# Patient Record
Sex: Female | Born: 2000 | Race: Black or African American | Hispanic: No | Marital: Single | Smoking: Never smoker
Health system: Southern US, Community
[De-identification: ages and names within clinical notes are randomized; demographics above are authoritative.]

## PROBLEM LIST (undated history)

## (undated) ENCOUNTER — Inpatient Hospital Stay (HOSPITAL_COMMUNITY): Payer: Self-pay

## (undated) DIAGNOSIS — T7840XA Allergy, unspecified, initial encounter: Secondary | ICD-10-CM

## (undated) DIAGNOSIS — J45909 Unspecified asthma, uncomplicated: Secondary | ICD-10-CM

## (undated) DIAGNOSIS — N390 Urinary tract infection, site not specified: Secondary | ICD-10-CM

## (undated) HISTORY — DX: Unspecified asthma, uncomplicated: J45.909

## (undated) HISTORY — DX: Allergy, unspecified, initial encounter: T78.40XA

## (undated) HISTORY — PX: TOE SURGERY: SHX1073

---
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
## 2019-01-11 DIAGNOSIS — Z113 Encounter for screening for infections with a predominantly sexual mode of transmission: Secondary | ICD-10-CM | POA: Diagnosis not present

## 2019-02-01 ENCOUNTER — Encounter (INDEPENDENT_AMBULATORY_CARE_PROVIDER_SITE_OTHER): Payer: Self-pay

## 2019-02-01 ENCOUNTER — Encounter: Payer: Self-pay | Admitting: Physician Assistant

## 2019-02-01 ENCOUNTER — Telehealth: Payer: BLUE CROSS/BLUE SHIELD | Admitting: Physician Assistant

## 2019-02-01 DIAGNOSIS — Z20822 Contact with and (suspected) exposure to covid-19: Secondary | ICD-10-CM

## 2019-02-01 DIAGNOSIS — R05 Cough: Secondary | ICD-10-CM

## 2019-02-01 DIAGNOSIS — R6889 Other general symptoms and signs: Secondary | ICD-10-CM

## 2019-02-01 DIAGNOSIS — Z8709 Personal history of other diseases of the respiratory system: Secondary | ICD-10-CM | POA: Insufficient documentation

## 2019-02-01 DIAGNOSIS — J45901 Unspecified asthma with (acute) exacerbation: Secondary | ICD-10-CM

## 2019-02-01 DIAGNOSIS — R0602 Shortness of breath: Secondary | ICD-10-CM

## 2019-02-01 DIAGNOSIS — R059 Cough, unspecified: Secondary | ICD-10-CM

## 2019-02-01 MED ORDER — PREDNISONE 20 MG PO TABS: 40.0000 mg | ORAL_TABLET | Freq: Every day | ORAL | 0 refills | Status: DC

## 2019-02-01 MED ORDER — BENZONATATE 100 MG PO CAPS: 100.0000 mg | ORAL_CAPSULE | Freq: Two times a day (BID) | ORAL | 0 refills | Status: DC | PRN

## 2019-02-01 MED ORDER — DOXYCYCLINE HYCLATE 100 MG PO TABS: 100.0000 mg | ORAL_TABLET | Freq: Two times a day (BID) | ORAL | 0 refills | Status: DC

## 2019-02-01 MED ORDER — ALBUTEROL SULFATE HFA 108 (90 BASE) MCG/ACT IN AERS: 2.0000 | INHALATION_SPRAY | Freq: Four times a day (QID) | RESPIRATORY_TRACT | 0 refills | Status: DC | PRN

## 2019-02-01 NOTE — Progress Notes (Signed)
E-Visit for Corona Virus Screening   Your current symptoms could be consistent with the coronavirus.  Many health care providers can now test patients at their office but not all are.  [place] has multiple testing sites. For information on our COVID testing locations and hours go to [email or web address]  Please quarantine yourself while awaiting your test results.  We are enrolling you in our [place] for [place] . Daily you will receive a questionnaire within the [place] website. Our COVID 19 response team willl be monitoriing your responses daily.    COVID-19 is a respiratory illness with symptoms that are similar to the flu. Symptoms are typically mild to moderate, but there have been cases of severe illness and death due to the virus. The following symptoms may appear 2-14 days after exposure: . Fever . Cough . Shortness of breath or difficulty breathing . Chills . Repeated shaking with chills . Muscle pain . Headache . Sore throat . New loss of taste or smell . Fatigue . Congestion or runny nose . Nausea or vomiting . Diarrhea  It is vitally important that if you feel that you have an infection such as this virus or any other virus that you stay home and away from places where you may spread it to others.  You should self-quarantine for 14 days if you have symptoms that could potentially be coronavirus or have been in close contact a with a person diagnosed with COVID-19 within the last 2 weeks. You should avoid contact with people age 46 and older.   You should wear a mask or cloth face covering over your nose and mouth if you must be around other people or animals, including pets (even at home). Try to stay at least 6 feet away from other people. This will protect the people around you.  You can use medication such as A prescription cough medication called Tessalon Perles 100 mg. You may take 1-2 capsules every 8 hours as needed for cough    I have also provided treatment for asthma exacerbation. Attempted to get more information form you with no reply. If your wheezing continues of if you develop any new symptoms please follow up with a face to face evaluation for further workup.   With history of Asthma and in setting of prolonged cough, prescribed Doxycyline 100 mg one pill BID for  Days.    You may also take acetaminophen (Tylenol) as needed for fever.  I have provided a work note    Reduce your risk of any infection by using the same precautions used for avoiding the common cold or flu:  Marland Kitchen Wash your hands often with soap and warm water for at least 20 seconds.  If soap and water are not readily available, use an alcohol-based hand sanitizer with at least 60% alcohol.  . If coughing or sneezing, cover your mouth and nose by coughing or sneezing into the elbow areas of your shirt or coat, into a tissue or into your sleeve (not your hands). . Avoid shaking hands with others and consider head nods or verbal greetings only. . Avoid touching your eyes, nose, or mouth with unwashed hands.  . Avoid close contact with people who are sick. . Avoid places or events with large numbers of people in one location, like concerts or sporting events. . Carefully consider travel plans you have or are making. . If you are planning any travel outside or inside the [place], visit the CDC's Travelers' Health webpage  for the latest health notices. . If you have some symptoms but not all symptoms, continue to monitor at home and seek medical attention if your symptoms worsen. . If you are having a medical emergency, call 911.  HOME CARE . Only take medications as instructed by your medical team. . Drink plenty of fluids and get plenty of rest. . A steam or ultrasonic humidifier can help if you have congestion.   GET HELP RIGHT AWAY IF YOU HAVE EMERGENCY WARNING SIGNS** FOR COVID-19. If you or someone is showing any of these signs seek emergency  medical care immediately. Call 911 or proceed to your closest emergency facility if: . You develop worsening high fever. . Trouble breathing . Bluish lips or face . Persistent pain or pressure in the chest . New confusion . Inability to wake or stay awake . You cough up blood. . Your symptoms become more severe  **This list is not all possible symptoms. Contact your medical provider for any symptoms that are sever or concerning to you.   MAKE SURE YOU   Understand these instructions.  Will watch your condition.  Will get help right away if you are not doing well or get worse.  Your e-visit answers were reviewed by a board certified advanced clinical practitioner to complete your personal care plan.  Depending on the condition, your plan could have included both over the counter or prescription medications.  If there is a problem please reply once you have received a response from your provider.  Your safety is important to us.  If you have drug allergies check your prescription carefully.    You can use MyChart to ask questions about today's visit, request a non-urgent call back, or ask for a work or school excuse for 24 hours related to this e-Visit. If it has been greater than 24 hours you will need to follow up with your provider, or enter a new e-Visit to address those concerns. You will get an e-mail in the next two days asking about your experience.  I hope that your e-visit has been valuable and will speed your recovery. Thank you for using e-visits.   I spent 5-10 minutes on review and completion of this note- Illa LevelSahar Samarth [facility]

## 2019-02-02 ENCOUNTER — Telehealth: Payer: BLUE CROSS/BLUE SHIELD | Admitting: Family

## 2019-02-02 ENCOUNTER — Encounter (INDEPENDENT_AMBULATORY_CARE_PROVIDER_SITE_OTHER): Payer: Self-pay

## 2019-02-02 DIAGNOSIS — Z0000 Encounter for general adult medical examination without abnormal findings: Secondary | ICD-10-CM | POA: Diagnosis not present

## 2019-02-02 DIAGNOSIS — Z1331 Encounter for screening for depression: Secondary | ICD-10-CM | POA: Diagnosis not present

## 2019-02-02 DIAGNOSIS — Z13220 Encounter for screening for lipoid disorders: Secondary | ICD-10-CM | POA: Diagnosis not present

## 2019-02-02 DIAGNOSIS — N898 Other specified noninflammatory disorders of vagina: Secondary | ICD-10-CM

## 2019-02-02 DIAGNOSIS — Z23 Encounter for immunization: Secondary | ICD-10-CM | POA: Diagnosis not present

## 2019-02-02 DIAGNOSIS — Z68.41 Body mass index (BMI) pediatric, greater than or equal to 95th percentile for age: Secondary | ICD-10-CM | POA: Diagnosis not present

## 2019-02-02 DIAGNOSIS — Z113 Encounter for screening for infections with a predominantly sexual mode of transmission: Secondary | ICD-10-CM | POA: Diagnosis not present

## 2019-02-02 DIAGNOSIS — Z713 Dietary counseling and surveillance: Secondary | ICD-10-CM | POA: Diagnosis not present

## 2019-02-02 NOTE — Progress Notes (Signed)
Based on what you shared with me, I feel your condition warrants further evaluation and I recommend that you be seen for a face to face office visit.  NOTE: If you entered your credit card information for this eVisit, you will not be charged. You may see a "hold" on your card for the $35 but that hold will drop off and you will not have a charge processed.  If you are having a true medical emergency please call 911.     For an urgent face to face visit, [place] has five urgent care centers for your convenience:    [email or web address] to reserve your spot online an avoid wait times  [facility] (New Address!) [address], [address] [place], [place] [address] *Just off [address], across the road from [employer]* Modified hours of operation: Monday-Friday, 12 PM to 6 PM  Closed Saturday & Sunday   The following sites will take your insurance:  . [facility]    [phone]                  Get Driving Directions  [address] [place], [place] [address] . 10 am to 8 pm Monday-Friday . 12 pm to 8 pm Saturday-Sunday   . [facility]  [phone]                  Get Driving Directions  [address], [address] [place], [place] [address] . 8 am to 8 pm Monday-Friday . 9 am to 6 pm Saturday . 11 am to 6 pm Sunday   . [facility] [place]  [phone]                  Get Driving Directions   [address] [place], [place] [address] . 8 am to 8 pm Monday-Friday . 8 am to 4 pm Saturday-Sunday    . [facility] [place]                    Get Driving Directions  [phone]  [address] [place], [place] [address]  . Monday-Friday, 12 PM to 6 PM    Your e-visit answers were reviewed by a board certified advanced clinical practitioner to complete your personal care plan.  Thank you for using e-Visits. 

## 2019-02-03 ENCOUNTER — Telehealth: Payer: BLUE CROSS/BLUE SHIELD | Admitting: Family

## 2019-02-03 ENCOUNTER — Encounter (INDEPENDENT_AMBULATORY_CARE_PROVIDER_SITE_OTHER): Payer: Self-pay

## 2019-02-03 DIAGNOSIS — N898 Other specified noninflammatory disorders of vagina: Secondary | ICD-10-CM

## 2019-02-03 NOTE — Progress Notes (Signed)
Based on what you shared with me, I feel your condition warrants further evaluation and I recommend that you be seen for a face to face office visit.  I see you had an Evisit yesterday and told to follow up face to face. I recommend you call your PCP or go to the Urgent care for further testing.    NOTE: If you entered your credit card information for this eVisit, you will not be charged. You may see a "hold" on your card for the $35 but that hold will drop off and you will not have a charge processed.  If you are having a true medical emergency please call 911.     For an urgent face to face visit, [place] has five urgent care centers for your convenience:    [email or web address] to reserve your spot online an avoid wait times  [facility] (New Address!) [address] [place], [place] [address] *Just off [address], across the road from [place] hours of operation: Monday-Friday, 12 PM to 6 PM  Closed Saturday & Sunday   The following sites will take your insurance:  . [facility]    [phone]                  Get Driving Directions  [address], [place] [address] . 10 am to 8 pm Monday-Friday . 12 pm to 8 pm Saturday-Sunday   . [facility] [place]                  Get Driving Directions  [address], [place], [place] [address] . 8 am to 8 pm Monday-Friday . 9 am to 6 pm Saturday . 11 am to 6 pm Sunday   . [facility] [place]                  Get Driving Directions   [address] [place], [place] [address] . 8 am to 8 pm Monday-Friday . 8 am to 4 pm Saturday-Sunday    . [facility] [place]                    Get Driving Directions  [phone]  [address] [place], [place] [address]  . Monday-Friday, 12 PM to 6 PM    Your  e-visit answers were reviewed by a board certified advanced clinical practitioner to complete your personal care plan.  Thank you for using e-Visits.

## 2019-02-04 ENCOUNTER — Encounter (INDEPENDENT_AMBULATORY_CARE_PROVIDER_SITE_OTHER): Payer: Self-pay

## 2019-02-07 ENCOUNTER — Encounter (INDEPENDENT_AMBULATORY_CARE_PROVIDER_SITE_OTHER): Payer: Self-pay

## 2019-02-09 ENCOUNTER — Encounter (INDEPENDENT_AMBULATORY_CARE_PROVIDER_SITE_OTHER): Payer: Self-pay

## 2019-02-10 ENCOUNTER — Telehealth: Payer: Self-pay

## 2019-02-10 ENCOUNTER — Encounter (INDEPENDENT_AMBULATORY_CARE_PROVIDER_SITE_OTHER): Payer: Self-pay

## 2019-02-10 NOTE — Telephone Encounter (Signed)
Pt. Returned call.  Stated "I feel like I'm getting sick."  Reported onset of feeling hot and cold, body aches, and headache, yesterday.  Reported onset of diarrhea today; has had 4-5 loose brown stools today.  Stated her cough and breathing is improved with her inhalers and cough medication.  Questioned about COVID exposure; reported boyfriend has been exposed to a COVID positive pt.  Advised of signs of dehydration; denied being dizzy, weak, or having decreased urine output.  Advised to increase fluid intake, and to monitor for worsening signs of dehydration.  Advised to rest and treat her symptoms of body aches with Tylenol or Ibuprofen; treat diarrhea with OTC antidiarrheal medications.  Encouraged to get a thermometer, and check temp. Twice/ day.  Also recommended to call her PCP today, for discussion of need for COVID testing, and for further recommendations in care.  Pt. Verb. Understanding and agreed with plan.      1. DIARRHEA SEVERITY: "How bad is the diarrhea?" "How many extra stools have you had in the past 24 hours than normal?"  - NO DIARRHEA (SCALE 0)  - MILD (SCALE 1-3): Few loose or mushy BMs; increase of 1-3 stools over normal daily number of stools; mild increase in ostomy output.  - MODERATE (SCALE 4-7): Increase of 4-6 stools daily over normal; moderate increase in ostomy output.  * SEVERE (SCALE 8-10; OR 'WORST POSSIBLE'): Increase of 7 or more stools daily over normal; moderate increase in ostomy output; incontinence.  4-5 loose brown stools today  2. ONSET: "When did the diarrhea begin?"  This AM  3. BM CONSISTENCY: "How loose or watery is the diarrhea?"  loose brown stools; denied presence of blood   4. VOMITING: "Are you also vomiting?" If so, ask: "How many times in the past 24 hours?" denied  5. ABDOMINAL PAIN: "Are you having any abdominal pain?" If yes: "What does it feel like?" (e.g., crampy, dull, intermittent, constan t) denied abdominal cramping  6.  ABDOMINAL PAIN SEVERITY: If present, ask: "How bad is the pain?" (e.g., Scale 1-10; mild, moderate, or severe)  - MILD (1-3): doesn't interfere with normal activities, abdomen soft and not tender to touch  - MODERATE (4-7): interferes with normal activities or awakens from sleep, tender to touch  - SEVERE (8-10): excruciating pain, doubled over, unable to do any normal activities   N/a  7. ORAL INTAKE: If vomiting, "Have you been able to drink liquids?" "How much fluids have you had in the past 24 hours?"  Taking sips of water  8. HYDRATION: "Any signs of dehydration?" (e.g., dry mouth [not just dry lips], too weak to stand, dizziness, new weight loss) "When did you last urinate?"  Denied any of the above  9. EXPOSURE: "Have you traveled to a foreign country recently?" "Have you been exposed to anyone with diarrhea?" "Could you have eaten any food that was spoiled?"  Denied recent travel   10. ANTIBIOTIC USE: "Are you taking antibiotics now or have you taken antibiotics in the past 2 months?"  Is on Diflucan for yeast infection one week ago  11. OTHER SYMPTOMS: "Do you have any other symptoms?" (e.g., fever, blood in stool)  Has not checked temp; c/o feeling hot and cold; denied blood in stool; denied abdominal cramping; c/o decreased appetite; denied n/v  12. PREGNANCY: "Is there any chance you are pregnant?" "When was your last menstrual period?" LMP; currently on menstrual cycle          Prompt

## 2019-02-10 NOTE — Telephone Encounter (Signed)
Attempted to call pt. To discuss c/o diarrhea as was noted on [employer].  Left vm to call [phone] to discuss with nurse.

## 2019-02-10 NOTE — Telephone Encounter (Signed)
Sent pt. Message in [place].

## 2019-02-13 ENCOUNTER — Encounter (INDEPENDENT_AMBULATORY_CARE_PROVIDER_SITE_OTHER): Payer: Self-pay

## 2019-02-16 ENCOUNTER — Encounter (HOSPITAL_COMMUNITY): Payer: Self-pay | Admitting: Emergency Medicine

## 2019-02-16 ENCOUNTER — Other Ambulatory Visit: Payer: Self-pay

## 2019-02-16 ENCOUNTER — Emergency Department (HOSPITAL_COMMUNITY)
Admission: EM | Admit: 2019-02-16 | Discharge: 2019-02-16 | Disposition: A | Discharge status: Home / Self Care | Payer: BC Managed Care – PPO | Attending: Emergency Medicine | Admitting: Emergency Medicine

## 2019-02-16 DIAGNOSIS — J028 Acute pharyngitis due to other specified organisms: Secondary | ICD-10-CM | POA: Diagnosis not present

## 2019-02-16 DIAGNOSIS — Z202 Contact with and (suspected) exposure to infections with a predominantly sexual mode of transmission: Secondary | ICD-10-CM | POA: Diagnosis not present

## 2019-02-16 DIAGNOSIS — R197 Diarrhea, unspecified: Secondary | ICD-10-CM | POA: Diagnosis not present

## 2019-02-16 DIAGNOSIS — Z20828 Contact with and (suspected) exposure to other viral communicable diseases: Secondary | ICD-10-CM

## 2019-02-16 DIAGNOSIS — M791 Myalgia, unspecified site: Secondary | ICD-10-CM | POA: Insufficient documentation

## 2019-02-16 DIAGNOSIS — B9789 Other viral agents as the cause of diseases classified elsewhere: Secondary | ICD-10-CM

## 2019-02-16 DIAGNOSIS — Z79899 Other long term (current) drug therapy: Secondary | ICD-10-CM | POA: Diagnosis not present

## 2019-02-16 DIAGNOSIS — J029 Acute pharyngitis, unspecified: Secondary | ICD-10-CM | POA: Diagnosis not present

## 2019-02-16 DIAGNOSIS — B2790 Infectious mononucleosis, unspecified without complication: Secondary | ICD-10-CM | POA: Diagnosis not present

## 2019-02-16 LAB — GROUP A STREP BY PCR: Group A Strep by PCR: NOT DETECTED

## 2019-02-16 NOTE — ED Provider Notes (Signed)
[facility] EMERGENCY DEPARTMENT Provider Note   CSN: [id number] Arrival date & time: 02/16/19  [id number]    History   Chief Complaint Chief Complaint  Patient presents with  . Mono Exposure-Wants testing    HPI Emily Foster is a 18 y.o. female, previously healthy.  History provided by patient.  Patient reports about 1 week ago, she started having sore throat, body aches, "just not feeling well."  Denies any fevers, shortness of breath, rashes, abdominal pain.  Does note that she has had diarrhea for the last 5 days, denies hematochezia.  States that she has a history of recurrent strep infections.  Also notes a recent mono exposure to her sister, who was diagnosed with mono yesterday.  Patient does note that she had mono recently in October 2019.  Patient reports vapping.  Past Medical History:  Diagnosis Date  . UTI (urinary tract infection)     Patient Active Problem List   Diagnosis Date Noted  . Yeast vaginitis 01/30/2017  . Chlamydia 01/30/2017    Past Surgical History:  Procedure Laterality Date  . TOE SURGERY       OB History    Gravida  0   Para  0   Term  0   Preterm  0   AB  0   Living  0     SAB  0   TAB  0   Ectopic  0   Multiple  0   Live Births  0            Home Medications    Prior to Admission medications   Medication Sig Start Date End Date Taking? Authorizing Provider  albuterol (VENTOLIN HFA) 108 (90 Base) MCG/ACT inhaler Inhale 2 puffs into the lungs every 6 (six) hours as needed for wheezing or shortness of breath. 02/01/19   Demetrio Lappingsman, Sahar M, PA-C  benzonatate (TESSALON) 100 MG capsule Take 1 capsule (100 mg total) by mouth 2 (two) times daily as needed for cough. 02/01/19   Demetrio Lappingsman, Sahar M, PA-C  doxycycline (VIBRA-TABS) 100 MG tablet Take 1 tablet (100 mg total) by mouth 2 (two) times daily. 02/01/19   Demetrio Lappingsman, Sahar M, PA-C  ibuprofen (ADVIL,MOTRIN) 600 MG tablet Take 1 tablet (600 mg total) by mouth every 6  (six) hours as needed. 01/31/18   Linwood DibblesKnapp, Jon, MD  ondansetron (ZOFRAN ODT) 8 MG disintegrating tablet Take 1 tablet (8 mg total) by mouth every 8 (eight) hours as needed for nausea or vomiting. Patient not taking: Reported on 08/24/2018 01/31/18   Linwood DibblesKnapp, Jon, MD  phenazopyridine (PYRIDIUM) 200 MG tablet Take 1 tablet (200 mg total) by mouth 3 (three) times daily. Patient not taking: Reported on 08/24/2018 05/02/18   Rasch, Victorino DikeJennifer I, NP  predniSONE (DELTASONE) 20 MG tablet Take 2 tablets (40 mg total) by mouth daily with breakfast. 02/01/19   Demetrio Lappingsman, Sahar M, PA-C    Family History No family history on file.  Social History Social History   Tobacco Use  . Smoking status: Never Smoker  . Smokeless tobacco: Never Used  Substance Use Topics  . Alcohol use: No  . Drug use: No     Allergies   Other and Soy allergy   Review of Systems Review of Systems  Constitutional: Negative for chills, fatigue and fever.  HENT: Positive for sore throat. Negative for congestion and trouble swallowing.   Eyes: Negative for visual disturbance.  Respiratory: Positive for cough (dry). Negative for shortness of breath.  Cardiovascular: Negative for chest pain and leg swelling.  Gastrointestinal: Positive for diarrhea. Negative for abdominal pain, blood in stool, nausea and vomiting.  Genitourinary: Negative for difficulty urinating.  Musculoskeletal: Positive for myalgias.  Neurological: Negative for headaches.     Physical Exam Updated Vital Signs BP 118/60 (BP Location: Right Arm)   Pulse 90   Temp 98.9 F (37.2 C) (Oral)   Resp 20   Ht 5\' 4"  (1.626 m)   Wt 87.5 kg   LMP 02/09/2019 (Exact Date)   SpO2 100%   BMI 33.13 kg/m   Physical Exam Constitutional:      General: She is not in acute distress.    Appearance: Normal appearance. She is not ill-appearing.  HENT:     Head: Normocephalic and atraumatic.     Nose: No congestion.     Mouth/Throat:     Mouth: Mucous membranes are moist.      Pharynx: Oropharyngeal exudate (minimal on right tonsil) and posterior oropharyngeal erythema (mild) present.     Comments: cobblestoning Eyes:     Conjunctiva/sclera: Conjunctivae normal.     Pupils: Pupils are equal, round, and reactive to light.  Neck:     Musculoskeletal: Normal range of motion and neck supple.  Cardiovascular:     Rate and Rhythm: Normal rate and regular rhythm.     Heart sounds: No murmur. No friction rub. No gallop.   Pulmonary:     Effort: Pulmonary effort is normal. No respiratory distress.     Breath sounds: Normal breath sounds. No wheezing, rhonchi or rales.  Abdominal:     General: Abdomen is flat. Bowel sounds are normal.     Palpations: Abdomen is soft.     Tenderness: There is no abdominal tenderness.     Comments: No HSM  Musculoskeletal:        General: No swelling.  Lymphadenopathy:     Cervical: Cervical adenopathy (left, non-tender) present.  Skin:    General: Skin is warm and dry.     Findings: No rash.  Neurological:     Mental Status: She is alert.  Psychiatric:        Mood and Affect: Mood normal.        Behavior: Behavior normal.      ED Treatments / Results  Labs (all labs ordered are listed, but only abnormal results are displayed) Labs Reviewed  GROUP A STREP BY PCR  GC/CHLAMYDIA PROBE AMP ([place]) NOT AT [facility]    EKG None  Radiology No results found.  Procedures Procedures (including critical care time)  Medications Ordered in ED Medications - No data to display   Initial Impression / Assessment and Plan / ED Course  I have reviewed the triage vital signs and the nursing notes.  Pertinent labs & imaging results that were available during my care of the patient were reviewed by me and considered in my medical decision making (see chart for details).  Emily Foster is a 18 yo previously healthy female who presents with 1 week of sore throat, cough, myalgias, diarrhea, and recent mono exposure.  Vitals are  stable in ED and patient is afebrile.  She also denies fevers at home.  On exam, patient does have mildly erythematous pharynx with minimal tonsillar exudate on right tonsil.  She also has a history of numerous strep infections, therefore will obtain strep to rule out.  Patient also with history of chlamydia, therefore will obtain G/C probe to rule out as cause of  sore throat.  Reassured the patient overall very well-appearing.  Also discussed that given her recent mono infection, doubt that she has been reinfected.  Strep test negative.  Likely viral in origin.  Discussed with patient return precautions including shortness of breath, she voiced understanding.  Gonorrhea and Chlamydia obtained, advised patient that she will be contacted with results.  Advised to follow-up with PCP if symptoms do not improve.  Advised to use Tylenol or ibuprofen as needed for pain.  Final Clinical Impressions(s) / ED Diagnoses   Final diagnoses:  Sore throat (viral)  Mono exposure    ED Discharge Orders    None       Cleophas Dunker, DO 02/16/19 1129    Elnora Morrison, MD 02/17/19 1139

## 2019-02-16 NOTE — Discharge Instructions (Addendum)
You were seen in the ED for concern for mono given exposure to your sister, and sore throat.  Your strep test was negative.  Your symptoms are likely caused by a virus or allergies.  You can continue to take ibuprofen or Tylenol as directed on the bottle for pain.  Should you have worsening your symptoms, or they do not improve in the next week, you should be seen by her doctor.  Come back to the ED if you have difficulty breathing.  Given that you have had mono before, it is unlikely that you will have mono again.  Symptoms of mono include fatigue, fever, sore throat, rash.  If you start experiencing the symptoms, see your doctor.  Someone will contact you with the results of gonorrhea/chlamydia testing.

## 2019-02-16 NOTE — ED Triage Notes (Signed)
Pt reports her sister has mono and wants to be checked for same.

## 2019-02-17 LAB — GC/CHLAMYDIA PROBE AMP (~~LOC~~) NOT AT ARMC
Chlamydia: NEGATIVE
Neisseria Gonorrhea: NEGATIVE

## 2019-03-09 ENCOUNTER — Telehealth: Payer: BC Managed Care – PPO | Admitting: Physician Assistant

## 2019-03-09 DIAGNOSIS — R197 Diarrhea, unspecified: Secondary | ICD-10-CM | POA: Diagnosis not present

## 2019-03-09 MED ORDER — LOPERAMIDE HCL 2 MG PO TABS: 2.0000 mg | ORAL_TABLET | Freq: Four times a day (QID) | ORAL | 0 refills | Status: DC | PRN

## 2019-03-09 NOTE — Progress Notes (Signed)
We are sorry that you are not feeling well.  Here is how we plan to help!  Based on what you have shared with me it looks like you have Acute Infectious Diarrhea.  Most cases of acute diarrhea are due to infections with virus and bacteria and are self-limited conditions lasting less than 14 days.  For your symptoms I have prescribed Imodium 2 mg tablets. These can also be purchased over the counter. Take two tablet now and then one after each loose stool up to 4 a day. This is an as-needed medication, so you do not need to take it if you are not having significant issues as a result of the diarrhea.  Antibiotics are not needed for most people with diarrhea.   HOME CARE  We recommend changing your diet to help with your symptoms for the next few days.  Drink plenty of fluids that contain water salt and sugar. Sports drinks such as Gatorade may help.   You may try broths, soups, bananas, applesauce, soft breads, mashed potatoes or crackers.   You are considered infectious for as long as the diarrhea continues. Hand washing or use of alcohol based hand sanitizers is recommend.  It is best to stay out of work or school until your symptoms stop.   GET HELP RIGHT AWAY  If you have dark yellow colored urine or do not pass urine frequently you should drink more fluids.    If your symptoms worsen   If you feel like you are going to pass out (faint)  You have a new problem  MAKE SURE YOU   Understand these instructions.  Will watch your condition.  Will get help right away if you are not doing well or get worse.  Your e-visit answers were reviewed by a board certified advanced clinical practitioner to complete your personal care plan.  Depending on the condition, your plan could have included both over the counter or prescription medications.  If there is a problem please reply  once you have received a response from your provider.  Your safety is important to [place].  If you have drug  allergies check your prescription carefully.    You can use MyChart to ask questions about today's visit, request a non-urgent call back, or ask for a work or school excuse for 24 hours related to this e-Visit. If it has been greater than 24 hours you will need to follow up with your provider, or enter a new e-Visit to address those concerns.   You will get an e-mail in the next two days asking about your experience.  I hope that your e-visit has been valuable and will speed your recovery. Thank you for using e-visits.  Approximately 5 minutes of time have been spent researching, coordinating, and implementing care for this patient today.

## 2019-03-16 ENCOUNTER — Telehealth: Payer: BC Managed Care – PPO | Admitting: Nurse Practitioner

## 2019-03-16 DIAGNOSIS — R110 Nausea: Secondary | ICD-10-CM

## 2019-03-16 MED ORDER — ONDANSETRON HCL 4 MG PO TABS: 4.0000 mg | ORAL_TABLET | Freq: Three times a day (TID) | ORAL | 0 refills | Status: DC | PRN

## 2019-03-16 NOTE — Progress Notes (Signed)
We are sorry that you are not feeling well. Here is how we plan to help!  Based on what you have shared with me it looks like you have a Virus that is irritating your GI tract. Nausea is the feeling likeyou need tovomit. Vomiting is the forceful emptying of a portion of the stomach's content through the mouth.  Although nausea and vomiting can make you feel miserable, it's important to remember that these are not diseases, but rather symptoms of an underlying illness.  When we treat short term symptoms, we always caution that any symptoms that persist should be fully evaluated in a medical office.  I have prescribed a medication that will help alleviate your symptoms and allow you to stay hydrated:  Zofran 4 mg 1 tablet every 8 hours as needed for nausea and vomiting  HOME CARE:  Drink clear liquids.  This is very important! Dehydration (the lack of fluid) can lead to a serious complication.  Start off with 1 tablespoon every 5 minutes for 8 hours.  You may begin eating bland foods after 8 hours without vomiting.  Start with saltine crackers, white bread, rice, mashed potatoes, applesauce.  After 48 hours on a bland diet, you may resume a normal diet.  Try to go to sleep.  Sleep often empties the stomach and relieves the need to vomit.  GET HELP RIGHT AWAY IF:   Your symptoms do not improve or worsen within 2 days after treatment.  You have a fever for over 3 days.  You cannot keep down fluids after trying the medication.  MAKE SURE YOU:   Understand these instructions.  Will watch your condition.  Will get help right away if you are not doing well or get worse.   Thank you for choosing an e-visit. Your e-visit answers were reviewed by a board certified advanced clinical practitioner to complete your personal care plan. Depending upon the condition, your plan could have included both over the counter or prescription medications. Please review your pharmacy choice. Be sure that  the pharmacy you have chosen is open so that you can pick up your prescription now.  If there is a problem you may message your provider in [place] to have the prescription routed to another pharmacy. Your safety is important to [place]. If you have drug allergies check your prescription carefully.  For the next 24 hours, you can use MyChart to ask questions about today's visit, request a non-urgent call back, or ask for a work or school excuse from your e-visit provider. You will get an e-mail in the next two days asking about your experience. I hope that your e-visit has been valuable and will speed your recovery.   5-10 minutes spent reviewing and documenting in chart.

## 2019-04-28 ENCOUNTER — Emergency Department (HOSPITAL_COMMUNITY)
Admission: EM | Admit: 2019-04-28 | Discharge: 2019-04-28 | Discharge status: Against Medical Advice | Payer: BC Managed Care – PPO

## 2019-04-28 ENCOUNTER — Other Ambulatory Visit: Payer: Self-pay

## 2019-04-28 NOTE — ED Notes (Signed)
No answer x 1 for triage

## 2019-05-15 ENCOUNTER — Telehealth: Payer: BC Managed Care – PPO | Admitting: Nurse Practitioner

## 2019-05-15 DIAGNOSIS — B3731 Acute candidiasis of vulva and vagina: Secondary | ICD-10-CM

## 2019-05-15 DIAGNOSIS — B373 Candidiasis of vulva and vagina: Secondary | ICD-10-CM | POA: Diagnosis not present

## 2019-05-15 MED ORDER — FLUCONAZOLE 150 MG PO TABS: 150.0000 mg | ORAL_TABLET | Freq: Once | ORAL | 0 refills | Status: AC

## 2019-05-15 NOTE — Progress Notes (Signed)

## 2019-06-29 ENCOUNTER — Telehealth: Payer: BC Managed Care – PPO

## 2019-06-30 ENCOUNTER — Telehealth: Payer: BC Managed Care – PPO

## 2019-08-31 ENCOUNTER — Telehealth: Payer: BC Managed Care – PPO | Admitting: Physician Assistant

## 2019-08-31 DIAGNOSIS — N760 Acute vaginitis: Secondary | ICD-10-CM | POA: Diagnosis not present

## 2019-08-31 MED ORDER — FLUCONAZOLE 150 MG PO TABS: 150.0000 mg | ORAL_TABLET | Freq: Once | ORAL | 0 refills | Status: AC

## 2019-08-31 NOTE — Progress Notes (Signed)
We are sorry that you are not feeling well. Here is how we plan to help! Based on what you shared with me it looks like you: May have a yeast vaginosis  Vaginosis is an inflammation of the vagina that can result in discharge, itching and pain. The cause is usually a change in the normal balance of vaginal bacteria or an infection. Vaginosis can also result from reduced estrogen levels after menopause.  Don't douche. Your vagina doesn't require cleansing other than normal bathing. Repetitive douching disrupts the normal organisms that reside in the vagina and can actually increase your risk of vaginal infection. Douching won't clear up a vaginal infection.  The most common causes of vaginosis are:   Bacterial vaginosis which results from an overgrowth of one on several organisms that are normally present in your vagina.   Yeast infections which are caused by a naturally occurring fungus called candida.   Vaginal atrophy (atrophic vaginosis) which results from the thinning of the vagina from reduced estrogen levels after menopause.   Trichomoniasis which is caused by a parasite and is commonly transmitted by sexual intercourse.  Factors that increase your risk of developing vaginosis include: Marland Kitchen Medications, such as antibiotics and steroids . Uncontrolled diabetes . Use of hygiene products such as bubble bath, vaginal spray or vaginal deodorant . Douching . Wearing damp or tight-fitting clothing . Using an intrauterine device (IUD) for birth control . Hormonal changes, such as those associated with pregnancy, birth control pills or menopause . Sexual activity . Having a sexually transmitted infection  Your treatment plan is A single Diflucan (fluconazole) 150mg  tablet once.  I have electronically sent this prescription into the pharmacy that you have chosen.  Be sure to take all of the medication as directed. Stop taking any medication if you develop a rash, tongue swelling or shortness of  breath. Mothers who are breast feeding should consider pumping and discarding their breast milk while on these antibiotics. However, there is no consensus that infant exposure at these doses would be harmful.  Remember that medication creams can weaken latex condoms. Marland Kitchen   HOME CARE:  Good hygiene may prevent some types of vaginosis from recurring and may relieve some symptoms:  . Avoid baths, hot tubs and whirlpool spas. Rinse soap from your outer genital area after a shower, and dry the area well to prevent irritation. Don't use scented or harsh soaps, such as those with deodorant or antibacterial action. Marland Kitchen Avoid irritants. These include scented tampons and pads. . Wipe from front to back after using the toilet. Doing so avoids spreading fecal bacteria to your vagina.  Other things that may help prevent vaginosis include:  Marland Kitchen Don't douche. Your vagina doesn't require cleansing other than normal bathing. Repetitive douching disrupts the normal organisms that reside in the vagina and can actually increase your risk of vaginal infection. Douching won't clear up a vaginal infection. . Use a latex condom. Both female and female latex condoms may help you avoid infections spread by sexual contact. . Wear cotton underwear. Also wear pantyhose with a cotton crotch. If you feel comfortable without it, skip wearing underwear to bed. Yeast thrives in [employer] Your symptoms should improve in the next day or two.  GET HELP RIGHT AWAY IF:  . You have pain in your lower abdomen ( pelvic area or over your ovaries) . You develop nausea or vomiting . You develop a fever . Your discharge changes or worsens . You have persistent pain with  intercourse . You develop shortness of breath, a rapid pulse, or you faint.  These symptoms could be signs of problems or infections that need to be evaluated by a medical provider now.  MAKE SURE YOU    Understand these instructions.  Will watch your  condition.  Will get help right away if you are not doing well or get worse.  Your e-visit answers were reviewed by a board certified advanced clinical practitioner to complete your personal care plan. Depending upon the condition, your plan could have included both over the counter or prescription medications. Please review your pharmacy choice to make sure that you have choses a pharmacy that is open for you to pick up any needed prescription, Your safety is important to [place]. If you have drug allergies check your prescription carefully.   You can use MyChart to ask questions about today's visit, request a non-urgent call back, or ask for a work or school excuse for 24 hours related to this e-Visit. If it has been greater than 24 hours you will need to follow up with your provider, or enter a new e-Visit to address those concerns. You will get a MyChart message within the next two days asking about your experience. I hope that your e-visit has been valuable and will speed your recovery.  Greater than 5 minutes, yet less than 10 minutes of time have been spent researching, coordinating and implementing care for this patient today.

## 2019-10-14 ENCOUNTER — Other Ambulatory Visit (INDEPENDENT_AMBULATORY_CARE_PROVIDER_SITE_OTHER): Payer: Self-pay | Admitting: Physician Assistant

## 2019-12-31 ENCOUNTER — Telehealth: Payer: BC Managed Care – PPO | Admitting: Nurse Practitioner

## 2019-12-31 DIAGNOSIS — R21 Rash and other nonspecific skin eruption: Secondary | ICD-10-CM

## 2019-12-31 NOTE — Progress Notes (Signed)
Based on what you shared with me it looks like you have perineal issue,that should be evaluated in a face to face office visit. This could be something as simple as a yeast rash and as complicated as herpes. You will need a face to face visit for proper treatment.    NOTE: If you entered your credit card information for this eVisit, you will not be charged. You may see a "hold" on your card for the $35 but that hold will drop off and you will not have a charge processed.  If you are having a true medical emergency please call 911.     For an urgent face to face visit,  has four urgent care centers for your convenience:   . [facility]    [phone]                  Get Driving Directions  [address] [place], [place] [address] . 10 am to 8 pm Monday-Friday . 12 pm to 8 pm Saturday-Sunday   . [facility] at [facility]  [phone]                  Get Driving Directions  [address], [address] [place], [place] [address] . 8 am to 8 pm Monday-Friday . 9 am to 6 pm Saturday . 11 am to 6 pm Sunday   . [facility] at [facility]  [phone]                  Get Driving Directions   [address] [place], [place] [address] . 8 am to 8 pm Monday-Friday . 8 am to 4 pm Saturday-Sunday    . [facility] [place] [facility] at [facility] Directions  [phone]  [address], [address] [place], [place] [address]  . Monday-Friday, 12 PM to 6 PM    Your e-visit answers were reviewed by a board certified advanced clinical practitioner to complete your personal care plan.  Thank you for using e-Visits.

## 2020-03-03 ENCOUNTER — Telehealth: Payer: BC Managed Care – PPO | Admitting: Physician Assistant

## 2020-03-03 DIAGNOSIS — N390 Urinary tract infection, site not specified: Secondary | ICD-10-CM

## 2020-03-03 MED ORDER — FLUCONAZOLE 150 MG PO TABS: 150.0000 mg | ORAL_TABLET | Freq: Once | ORAL | 0 refills | Status: AC

## 2020-03-03 MED ORDER — CEPHALEXIN 500 MG PO CAPS: ORAL_CAPSULE | ORAL | 0 refills | Status: DC

## 2020-03-03 NOTE — Progress Notes (Signed)
We are sorry that you are not feeling well.  Here is how we plan to help!  Based on what you shared with me it looks like you most likely have a simple urinary tract infection, however it is uncommon to have diarrhea with a UTI.  If your symptoms are not improved within 24-48 hours you will need a face to face visit.  I will also write for diflucan for a possible yeast infection.  A UTI (Urinary Tract Infection) is a bacterial infection of the bladder.  Most cases of urinary tract infections are simple to treat but a key part of your care is to encourage you to drink plenty of fluids and watch your symptoms carefully.  I have prescribed Keflex 500 mg twice a day for 7 days.  Your symptoms should gradually improve. Call us if the burning in your urine worsens, you develop worsening fever, back pain or pelvic pain or if your symptoms do not resolve after completing the antibiotic.  Urinary tract infections can be prevented by drinking plenty of water to keep your body hydrated.  Also be sure when you wipe, wipe from front to back and don't hold it in!  If possible, empty your bladder every 4 hours.  Your e-visit answers were reviewed by a board certified advanced clinical practitioner to complete your personal care plan.  Depending on the condition, your plan could have included both over the counter or prescription medications.  If there is a problem please reply  once you have received a response from your provider.  Your safety is important to [place].  If you have drug allergies check your prescription carefully.    You can use MyChart to ask questions about today's visit, request a non-urgent call back, or ask for a work or school excuse for 24 hours related to this e-Visit. If it has been greater than 24 hours you will need to follow up with your provider, or enter a new e-Visit to address those concerns.   You will get an e-mail in the next two days asking about your experience.  I hope that  your e-visit has been valuable and will speed your recovery. Thank you for using e-visits.   Greater than 5 minutes, yet less than 10 minutes of time have been spent researching, coordinating, and implementing care for this patient today

## 2020-06-29 NOTE — Progress Notes (Signed)
Patient did not show for appointment.   

## 2020-06-30 ENCOUNTER — Encounter: Payer: BC Managed Care – PPO | Admitting: Family

## 2020-07-03 ENCOUNTER — Encounter: Payer: Self-pay | Admitting: Physician Assistant

## 2020-07-03 ENCOUNTER — Ambulatory Visit (INDEPENDENT_AMBULATORY_CARE_PROVIDER_SITE_OTHER): Payer: BC Managed Care – PPO | Admitting: Physician Assistant

## 2020-07-03 ENCOUNTER — Other Ambulatory Visit: Payer: Self-pay

## 2020-07-03 VITALS — BP 110/70 | HR 81 | Temp 97.3°F | Ht 64.5 in | Wt 224.0 lb

## 2020-07-03 DIAGNOSIS — Z23 Encounter for immunization: Secondary | ICD-10-CM

## 2020-07-03 DIAGNOSIS — N926 Irregular menstruation, unspecified: Secondary | ICD-10-CM

## 2020-07-03 DIAGNOSIS — J4540 Moderate persistent asthma, uncomplicated: Secondary | ICD-10-CM | POA: Diagnosis not present

## 2020-07-03 DIAGNOSIS — R635 Abnormal weight gain: Secondary | ICD-10-CM | POA: Diagnosis not present

## 2020-07-03 DIAGNOSIS — E669 Obesity, unspecified: Secondary | ICD-10-CM | POA: Diagnosis not present

## 2020-07-03 LAB — COMPREHENSIVE METABOLIC PANEL
ALT: 17 U/L (ref 0–35)
AST: 19 U/L (ref 0–37)
Albumin: 4.5 g/dL (ref 3.5–5.2)
Alkaline Phosphatase: 88 U/L (ref 39–117)
BUN: 13 mg/dL (ref 6–23)
CO2: 25 mEq/L (ref 19–32)
Calcium: 9.6 mg/dL (ref 8.4–10.5)
Chloride: 105 mEq/L (ref 96–112)
Creatinine, Ser: 0.84 mg/dL (ref 0.40–1.20)
GFR: 99.91 mL/min (ref >60.00)
Glucose, Bld: 88 mg/dL (ref 70–99)
Potassium: 4.3 mEq/L (ref 3.5–5.1)
Sodium: 138 mEq/L (ref 135–145)
Total Bilirubin: 0.3 mg/dL (ref 0.2–1.2)
Total Protein: 7.3 g/dL (ref 6.0–8.3)

## 2020-07-03 LAB — CBC WITH DIFFERENTIAL/PLATELET
Basophils Absolute: 0 10*3/uL (ref 0.0–0.1)
Basophils Relative: 0.5 % (ref 0.0–3.0)
Eosinophils Absolute: 0.1 10*3/uL (ref 0.0–0.7)
Eosinophils Relative: 0.8 % (ref 0.0–5.0)
HCT: 37.3 % (ref 36.0–46.0)
Hemoglobin: 12.1 g/dL (ref 12.0–15.0)
Lymphocytes Relative: 33.8 % (ref 12.0–46.0)
Lymphs Abs: 2.5 10*3/uL (ref 0.7–4.0)
MCHC: 32.5 g/dL (ref 30.0–36.0)
MCV: 85.8 fl (ref 78.0–100.0)
Monocytes Absolute: 0.7 10*3/uL (ref 0.1–1.0)
Monocytes Relative: 8.9 % (ref 3.0–12.0)
Neutro Abs: 4.1 10*3/uL (ref 1.4–7.7)
Neutrophils Relative %: 56 % (ref 43.0–77.0)
Platelets: 236 10*3/uL (ref 150.0–400.0)
RBC: 4.34 Mil/uL (ref 3.87–5.11)
RDW: 13.2 % (ref 11.5–14.6)
WBC: 7.4 10*3/uL (ref 4.5–10.5)

## 2020-07-03 LAB — TSH: TSH: 1.07 u[IU]/mL (ref 0.35–5.50)

## 2020-07-03 LAB — HEMOGLOBIN A1C: Hgb A1c MFr Bld: 5.3 % (ref 4.6–6.5)

## 2020-07-03 MED ORDER — FLUTICASONE PROPIONATE HFA 44 MCG/ACT IN AERO: 1.0000 | INHALATION_SPRAY | Freq: Two times a day (BID) | RESPIRATORY_TRACT | 12 refills | Status: DC

## 2020-07-03 MED ORDER — ALBUTEROL SULFATE HFA 108 (90 BASE) MCG/ACT IN AERS: 2.0000 | INHALATION_SPRAY | Freq: Four times a day (QID) | RESPIRATORY_TRACT | 0 refills | Status: DC | PRN

## 2020-07-03 NOTE — Patient Instructions (Signed)
It was great to see you!  For your asthma: 1. Use Flovent inhaler in the morning and at night 2. Use albuterol in between If you are still requiring albuterol more than 3-4 times a week after a month of Flovent, come back and see me.  Update your blood work today  I will refer to gynecology so they can assess your irregular periods and remove Nexplanon.  Take care,  Jarold Motto PA-C

## 2020-07-03 NOTE — Progress Notes (Signed)
Emily Foster is a 20 y.o. female is here to establish care and discuss PCOS.  I acted as a Neurosurgeon for [employer], PA-C Corky Mull, LPN   History of Present Illness:   Chief Complaint  Patient presents with  . Establish Care  . PCOS  . Menstrual Problem    HPI   Medication issue Pt has been having irregular periods x 1 year as well as weight gain. Pt has a Nexplanon that was inserted in January 04 2018. She actually went to her ob-gyn after a year of using the Nexplanon and asked for it to be removed due to possible side effects, but she was told that she had to keep this in for 3 years.  Denies: pelvic pain, polyuria/polydipsia, hair growth on face, irregular periods prior to nexplanon use  Asthma Uses albuterol daily. Has been  Admitted to hospital with asthma exacerbation in the past 3 years. Has never been on maintenance inhaler.   Health Maintenance Due  Topic Date Due  . Hepatitis C Screening  Never done  . HIV Screening  Never done  . INFLUENZA VACCINE  01/30/2020  . CHLAMYDIA SCREENING  02/16/2020    Past Medical History:  Diagnosis Date  . Allergy   . Asthma   . UTI (urinary tract infection)      Social History   Tobacco Use  . Smoking status: Never Smoker  . Smokeless tobacco: Never Used  Vaping Use  . Vaping Use: Every day  Substance Use Topics  . Alcohol use: No  . Drug use: No    Past Surgical History:  Procedure Laterality Date  . TOE SURGERY      Family History  Problem Relation Age of Onset  . Diabetes Mother   . Diabetes Paternal Grandmother   . Diabetes Paternal Grandfather     PMHx, SurgHx, SocialHx, FamHx, Medications, and Allergies were reviewed in the Visit Navigator and updated as appropriate.   Patient Active Problem List   Diagnosis Date Noted  . Yeast vaginitis 01/30/2017  . Chlamydia 01/30/2017    Social History   Tobacco Use  . Smoking status: Never Smoker  . Smokeless tobacco: Never Used  Vaping Use   . Vaping Use: Every day  Substance Use Topics  . Alcohol use: No  . Drug use: No    Current Medications and Allergies:    Current Outpatient Medications:  .  acetaminophen (TYLENOL) 500 MG tablet, Take 1,000 mg by mouth every 6 (six) hours as needed., Disp: , Rfl:  .  Ascorbic Acid (VITAMIN C) 100 MG CHEW, Chew 1 each by mouth daily in the afternoon., Disp: , Rfl:  .  Cholecalciferol (VITAMIN D3) 125 MCG (5000 UT) CAPS, Take 1 capsule by mouth daily in the afternoon., Disp: , Rfl:  .  Cyanocobalamin (VITAMIN B12 PO), Take 1 each by mouth daily in the afternoon., Disp: , Rfl:  .  etonogestrel (NEXPLANON) 68 MG IMPL implant, 1 each by Subdermal route once. Inserted 2019, Disp: , Rfl:  .  fluticasone (FLOVENT HFA) 44 MCG/ACT inhaler, Inhale 1 puff into the lungs in the morning and at bedtime., Disp: 1 each, Rfl: 12 .  albuterol (VENTOLIN HFA) 108 (90 Base) MCG/ACT inhaler, Inhale 2 puffs into the lungs every 6 (six) hours as needed for wheezing or shortness of breath., Disp: 6.7 g, Rfl: 0   Allergies  Allergen Reactions  . Other Itching, Rash and Swelling  . Soy Allergy Itching, Swelling and Rash  Review of Systems   ROS Negative unless otherwise specified per HPI.  Vitals:   Vitals:   07/03/20 0952  BP: 110/70  Pulse: 81  Temp: (!) 97.3 F (36.3 C)  TempSrc: Temporal  SpO2: 98%  Weight: 224 lb (101.6 kg)  Height: 5' 4.5" (1.638 m)     Body mass index is 37.86 kg/m.   Physical Exam:    Physical Exam Vitals and nursing note reviewed.  Constitutional:      General: She is not in acute distress.    Appearance: She is well-developed. She is not ill-appearing, toxic-appearing or sickly-appearing.  Cardiovascular:     Rate and Rhythm: Normal rate and regular rhythm.     Pulses: Normal pulses.     Heart sounds: Normal heart sounds, S1 normal and S2 normal.     Comments: No LE edema Pulmonary:     Effort: Pulmonary effort is normal.     Breath sounds: Normal  breath sounds.  Skin:    General: Skin is warm, dry and intact.  Neurological:     Mental Status: She is alert.     GCS: GCS eye subscore is 4. GCS verbal subscore is 5. GCS motor subscore is 6.  Psychiatric:        Mood and Affect: Mood and affect normal.        Speech: Speech normal.        Behavior: Behavior normal. Behavior is cooperative.      Assessment and Plan:    Emily was seen today for establish care, pcos and menstrual problem.  Diagnoses and all orders for this visit:  Obesity, unspecified classification, unspecified obesity type, unspecified whether serious comorbidity present; Weight gain Update blood work today per patient request. Suspect due to Nexplanon and lack of healthy eating/exercise. Will have patient follow-up with [place] after gynecology visit to further discuss if desired. -     Hemoglobin A1c -     TSH -     CBC with Differential/Platelet -     Comprehensive metabolic panel  Irregular periods Suspect due to nexplanon, will place gynecology referral. -     Ambulatory referral to Gynecology  Moderate persistent asthma without complication Uncontrolled. Start daily ICS, flovent BID. Albuterol prn. Follow-up if still requiring frequent use of albuterol.  Other orders -     fluticasone (FLOVENT HFA) 44 MCG/ACT inhaler; Inhale 1 puff into the lungs in the morning and at bedtime. -     albuterol (VENTOLIN HFA) 108 (90 Base) MCG/ACT inhaler; Inhale 2 puffs into the lungs every 6 (six) hours as needed for wheezing or shortness of breath.  CMA or LPN served as scribe during this visit. History, Physical, and Plan performed by medical provider. The above documentation has been reviewed and is accurate and complete.  Jarold Motto, PA-C [place], [place] 07/03/2020  Follow-up: No follow-ups on file.

## 2020-07-13 ENCOUNTER — Encounter (HOSPITAL_COMMUNITY): Payer: Self-pay | Admitting: Emergency Medicine

## 2020-07-13 ENCOUNTER — Other Ambulatory Visit: Payer: Self-pay

## 2020-07-13 ENCOUNTER — Emergency Department (HOSPITAL_COMMUNITY)
Admission: EM | Admit: 2020-07-13 | Discharge: 2020-07-13 | Disposition: A | Discharge status: Home / Self Care | Payer: BC Managed Care – PPO | Attending: Emergency Medicine | Admitting: Emergency Medicine

## 2020-07-13 ENCOUNTER — Emergency Department (HOSPITAL_COMMUNITY): Payer: BC Managed Care – PPO

## 2020-07-13 DIAGNOSIS — J45909 Unspecified asthma, uncomplicated: Secondary | ICD-10-CM | POA: Insufficient documentation

## 2020-07-13 DIAGNOSIS — R000 Tachycardia, unspecified: Secondary | ICD-10-CM | POA: Insufficient documentation

## 2020-07-13 DIAGNOSIS — Z7951 Long term (current) use of inhaled steroids: Secondary | ICD-10-CM | POA: Diagnosis not present

## 2020-07-13 DIAGNOSIS — R112 Nausea with vomiting, unspecified: Secondary | ICD-10-CM

## 2020-07-13 DIAGNOSIS — R0781 Pleurodynia: Secondary | ICD-10-CM | POA: Diagnosis not present

## 2020-07-13 DIAGNOSIS — U071 COVID-19: Secondary | ICD-10-CM | POA: Diagnosis not present

## 2020-07-13 DIAGNOSIS — R059 Cough, unspecified: Secondary | ICD-10-CM | POA: Diagnosis present

## 2020-07-13 DIAGNOSIS — R0602 Shortness of breath: Secondary | ICD-10-CM | POA: Diagnosis not present

## 2020-07-13 LAB — I-STAT BETA HCG BLOOD, ED (MC, WL, AP ONLY): I-stat hCG, quantitative: <5 m[IU]/mL (ref <5)

## 2020-07-13 LAB — CBC
HCT: 42.1 % (ref 36.0–46.0)
Hemoglobin: 13.6 g/dL (ref 12.0–15.0)
MCH: 27.5 pg (ref 26.0–34.0)
MCHC: 32.3 g/dL (ref 30.0–36.0)
MCV: 85.2 fL (ref 80.0–100.0)
Platelets: 273 10*3/uL (ref 150–400)
RBC: 4.94 MIL/uL (ref 3.87–5.11)
RDW: 12.4 % (ref 11.5–15.5)
WBC: 6.6 10*3/uL (ref 4.0–10.5)
nRBC: 0 % (ref 0.0–0.2)

## 2020-07-13 LAB — BASIC METABOLIC PANEL
Anion gap: 16 — ABNORMAL HIGH (ref 5–15)
BUN: 6 mg/dL (ref 6–20)
CO2: 18 mmol/L — ABNORMAL LOW (ref 22–32)
Calcium: 10 mg/dL (ref 8.9–10.3)
Chloride: 103 mmol/L (ref 98–111)
Creatinine, Ser: 0.88 mg/dL (ref 0.44–1.00)
GFR, Estimated: >60 mL/min (ref >60)
Glucose, Bld: 114 mg/dL — ABNORMAL HIGH (ref 70–99)
Potassium: 3.7 mmol/L (ref 3.5–5.1)
Sodium: 137 mmol/L (ref 135–145)

## 2020-07-13 LAB — TROPONIN I (HIGH SENSITIVITY)
Troponin I (High Sensitivity): <2 ng/L (ref <18)
Troponin I (High Sensitivity): <2 ng/L (ref <18)

## 2020-07-13 LAB — RESP PANEL BY RT-PCR (FLU A&B, COVID) ARPGX2
Influenza A by PCR: NEGATIVE
Influenza B by PCR: NEGATIVE
SARS Coronavirus 2 by RT PCR: POSITIVE — AB

## 2020-07-13 MED ORDER — ONDANSETRON 4 MG PO TBDP
4.0000 mg | ORAL_TABLET | Freq: Once | ORAL | Status: AC
  Administered 2020-07-13: 4 mg via ORAL
  Filled 2020-07-13: qty 1

## 2020-07-13 MED ORDER — ONDANSETRON HCL 4 MG PO TABS: 4.0000 mg | ORAL_TABLET | Freq: Every day | ORAL | 0 refills | Status: DC | PRN

## 2020-07-13 MED ORDER — ONDANSETRON 4 MG PO TBDP: 8.0000 mg | ORAL_TABLET | Freq: Once | ORAL | Status: DC

## 2020-07-13 NOTE — ED Triage Notes (Signed)
Patient with complaint of intensely painful inspiration that started suddenly yesterday. Reports fever of 102.29F yesterday, temperature 99.32F during triage. Reports many people in household are sick but none have been tested for COVID. Denies receiving COVID vaccine. SpO2 on room air 100%.

## 2020-07-13 NOTE — ED Provider Notes (Signed)
[facility] EMERGENCY DEPARTMENT Provider Note   CSN: [id number] Arrival date & time: 07/13/20  4008     History Chief Complaint  Patient presents with  . Chest Pain    Emily Foster is a 20 y.o. female.  Patient reports that she has been experiencing 1 to 2 days of myalgias, pleuritic chest pain productive cough, vomiting, lower back pain, fever and diarrhea.  She reports that her household has been feeling ill.  Patient denies receiving any COVID-19 vaccines.  She states that she has had 4-5 episodes of nonbilious nonbloody emesis and has had 3-4 episodes of diarrhea.  She denies hematochezia.  States that she is not able to tolerate oral fluids or food stating that she vomits soon after trying to eat anything.         Past Medical History:  Diagnosis Date  . Allergy   . Asthma   . UTI (urinary tract infection)     Patient Active Problem List   Diagnosis Date Noted  . Yeast vaginitis 01/30/2017  . Chlamydia 01/30/2017    Past Surgical History:  Procedure Laterality Date  . TOE SURGERY       OB History    Gravida  0   Para  0   Term  0   Preterm  0   AB  0   Living  0     SAB  0   IAB  0   Ectopic  0   Multiple  0   Live Births  0           Family History  Problem Relation Age of Onset  . Diabetes Mother   . Diabetes Paternal Grandmother   . Diabetes Paternal Grandfather     Social History   Tobacco Use  . Smoking status: Never Smoker  . Smokeless tobacco: Never Used  Vaping Use  . Vaping Use: Every day  Substance Use Topics  . Alcohol use: No  . Drug use: No    Home Medications Prior to Admission medications   Medication Sig Start Date End Date Taking? Authorizing Provider  ondansetron (ZOFRAN) 4 MG tablet Take 1 tablet (4 mg total) by mouth daily as needed for nausea or vomiting. 07/13/20 07/13/21 Yes Simmons-Robinson, Teniqua Marron, MD  acetaminophen (TYLENOL) 500 MG tablet Take 1,000 mg by mouth every 6 (six)  hours as needed.    [provider]  albuterol (VENTOLIN HFA) 108 (90 Base) MCG/ACT inhaler Inhale 2 puffs into the lungs every 6 (six) hours as needed for wheezing or shortness of breath. 07/03/20   Jarold Motto, PA  Ascorbic Acid (VITAMIN C) 100 MG CHEW Chew 1 each by mouth daily in the afternoon.    [provider]  Cholecalciferol (VITAMIN D3) 125 MCG (5000 UT) CAPS Take 1 capsule by mouth daily in the afternoon.    [provider]  Cyanocobalamin (VITAMIN B12 PO) Take 1 each by mouth daily in the afternoon.    [provider]  etonogestrel (NEXPLANON) 68 MG IMPL implant 1 each by Subdermal route once. Inserted 2019    [provider]  fluticasone (FLOVENT HFA) 44 MCG/ACT inhaler Inhale 1 puff into the lungs in the morning and at bedtime. 07/03/20   Jarold Motto, PA    Allergies    Other and Soy allergy  Review of Systems   Review of Systems  Constitutional: Positive for appetite change and fever.    Physical Exam Updated Vital Signs BP 99/67  Pulse (!) 115   Temp 99.2 F (37.3 C) (Oral)   Resp (!) 23   LMP 06/19/2020   SpO2 95%   Physical Exam Vitals reviewed.  Constitutional:      Appearance: She is obese. She is ill-appearing. She is not toxic-appearing or diaphoretic.  HENT:     Right Ear: External ear normal.     Left Ear: External ear normal.     Nose: No rhinorrhea.  Eyes:     Extraocular Movements: Extraocular movements intact.     Conjunctiva/sclera: Conjunctivae normal.     Pupils: Pupils are equal, round, and reactive to light.  Cardiovascular:     Rate and Rhythm: Tachycardia present.     Pulses: Normal pulses.     Heart sounds: Normal heart sounds. No murmur heard.   Pulmonary:     Effort: Pulmonary effort is normal. No respiratory distress.     Breath sounds: Normal breath sounds. No wheezing or rhonchi.     Comments: Breathing comfortably on RA Chest:     Chest wall: No tenderness.  Abdominal:      General: Bowel sounds are normal. There is no distension.     Palpations: Abdomen is soft. There is no mass.     Tenderness: There is no abdominal tenderness. There is no right CVA tenderness or left CVA tenderness.  Skin:    General: Skin is warm and dry.     Findings: No erythema or rash.  Neurological:     Mental Status: She is alert and oriented to person, place, and time.     Motor: No weakness.     Gait: Gait normal.     ED Results / Procedures / Treatments   Labs (all labs ordered are listed, but only abnormal results are displayed) Labs Reviewed  RESP PANEL BY RT-PCR (FLU A&B, COVID) ARPGX2 - Abnormal; Notable for the following components:      Result Value   SARS Coronavirus 2 by RT PCR POSITIVE (*)    All other components within normal limits  BASIC METABOLIC PANEL - Abnormal; Notable for the following components:   CO2 18 (*)    Glucose, Bld 114 (*)    Anion gap 16 (*)    All other components within normal limits  CBC  I-STAT BETA HCG BLOOD, ED ([facility], [facility], [facility] ONLY)  TROPONIN I (HIGH SENSITIVITY)  TROPONIN I (HIGH SENSITIVITY)    EKG EKG Interpretation  Date/Time:  Thursday July 13 2020 07:29:43 EST Ventricular Rate:  101 PR Interval:  118 QRS Duration: 76 QT Interval:  332 QTC Calculation: 430 R Axis:   77 Text Interpretation: Sinus tachycardia Nonspecific T wave abnormality Abnormal ECG Confirmed by Blane Ohara [phone]) on 07/13/2020 8:30:11 AM   Radiology DG Chest 2 View  Result Date: 07/13/2020 CLINICAL DATA:  Shortness of breath EXAM: CHEST - 2 VIEW COMPARISON:  None. FINDINGS: Lungs are clear. Heart size and pulmonary vascularity are normal. No adenopathy. No bone lesions. IMPRESSION: Lungs clear.  Cardiac silhouette normal. Electronically Signed   By: Bretta Bang III M.D.   On: 07/13/2020 07:52    Procedures Procedures (including critical care time)  Medications Ordered in ED Medications  ondansetron (ZOFRAN-ODT) disintegrating tablet 4  mg (4 mg Oral Given 07/13/20 1012)    ED Course  I have reviewed the triage vital signs and the nursing notes.  Pertinent labs & imaging results that were available during my care of the patient were reviewed by me and considered  in my medical decision making (see chart for details).  Clinical Course as of 07/13/20 1050  Thu Jul 13, 2020  1015 Patient presents with pleuritic chest pain, COVID + with multiple episodes of diarrhea and emesis. Given Zofran 4mg  ODT and plan for PO challenge. CXR without abnormalities. Normal SR on EKG without signs of ischemia  [MS]  1017 EKG 12-Lead [MS]  1017 ED EKG [MS]  1048 Patient tolerating PO crackers and ginger ale. Normal CBC and BMP.  [MS]    Clinical Course User Index [MS] Simmons-Robinson, , MD   MDM Rules/Calculators/A&P                          Patient is 20 year old female with hx of asthma who is presenting with symptoms consistent with + COVID 19 infection. Given age and covid infection with hx of asthma, considered PE as she is mildly tachycardic but given her oxygen saturations 100% and setting of dehydration due to emesis and decreased oral intake, low suspicion for PE. Chest discomfort most likely pleuritic chest pain due to COVID. Troponin level within normal limits at 2. Patient responded well to dose of Zofran and was able to tolerate PO fluids challenge without subsequent emesis. Observation in ED did not show hypoxia. Patient agreeable for outpatient remdesivir referral and reports feeling better after Zofran. Patient determined to be appropriate for discharge.   Emily Foster was evaluated in Emergency Department on 07/13/2020 for the symptoms described in the history of present illness. She was evaluated in the context of the global COVID-19 pandemic, which necessitated consideration that the patient might be at risk for infection with the SARS-CoV-2 virus that causes COVID-19. Institutional protocols and algorithms that pertain  to the evaluation of patients at risk for COVID-19 are in a state of rapid change based on information released by regulatory bodies including the CDC and federal and state organizations. These policies and algorithms were followed during the patient's care in the ED.  Final Clinical Impression(s) / ED Diagnoses Final diagnoses:  COVID-19  Non-intractable vomiting with nausea, unspecified vomiting type    Rx / DC Orders ED Discharge Orders         Ordered    Ambulatory referral for Covid Treatment       Comments: Hx of asthma, obesity  07/15/2020 Emily Foster, 20years old, MRN Emily   07/13/20 1039    ondansetron (ZOFRAN) 4 MG tablet  Daily PRN        07/13/20 1042           Simmons-Robinson, 07/15/20, MD 07/13/20 1051    07/15/20, MD 07/13/20 1122

## 2020-07-13 NOTE — Discharge Instructions (Addendum)
Your symptoms are most likely related to your COVID infection.   You were treated with Zofran to help with your emesis. Please continue to drink plenty of water and pedialyte to replace electrolytes and remain hydrated as you recover from your illness.   It is recommended that you follow up with your PCP for symptom management and wear your masks around others in your household.   You should plan to isolate in your home for 5-10 days and not return to work until you have been without a fever for 24 hours without the assistance of medication.   We have referred you for the Remdesivir infusion as outpatient. Be on the look out for a call from them in order to schedule your appointment.

## 2020-07-14 ENCOUNTER — Telehealth (HOSPITAL_COMMUNITY): Payer: Self-pay | Admitting: Internal Medicine

## 2020-07-14 ENCOUNTER — Encounter (HOSPITAL_COMMUNITY): Payer: Self-pay

## 2020-07-14 ENCOUNTER — Telehealth: Payer: Self-pay

## 2020-07-14 NOTE — Telephone Encounter (Signed)
Called to discuss with patient about COVID-19 symptoms and the use of one of the available treatments for those with mild to moderate Covid symptoms and at a high risk of hospitalization.  Pt appears to qualify for outpatient treatment due to co-morbid conditions and/or a member of an at-risk group in accordance with the FDA Emergency Use Authorization.    Symptom onset: 1/11 -07/12/20 per ED note Vaccinated: Not stated in ED note' Booster? Unclear Immunocompromised? No Qualifiers: Asthma, BMI >25  Unable to reach pt - Left pt. Message with call back number [phone].  Emily Foster  Pt. Referred by ED for Remdesivir.

## 2020-07-14 NOTE — Telephone Encounter (Signed)
Called to discuss with patient about COVID-19 symptoms and the use of one of the available treatments for those with mild to moderate Covid symptoms and at a high risk of hospitalization.  Pt appears to qualify for outpatient treatment due to co-morbid conditions and/or a member of an at-risk group in accordance with the FDA Emergency Use Authorization.    Symptom onset: unknown Vaccinated: no per ED note. Need to clarify Booster? unknown Immunocompromised? no Qualifiers: BMI   Unable to reach pt. Sent [facility] with hotline number to call if she is interested in determining if she is a candidate for outpt covid tx.   Cyndee Brightly, NP [facility]

## 2020-07-21 ENCOUNTER — Other Ambulatory Visit: Payer: Self-pay | Admitting: Physician Assistant

## 2020-08-22 DIAGNOSIS — F331 Major depressive disorder, recurrent, moderate: Secondary | ICD-10-CM | POA: Diagnosis not present

## 2020-08-22 DIAGNOSIS — F4312 Post-traumatic stress disorder, chronic: Secondary | ICD-10-CM | POA: Diagnosis not present

## 2020-08-30 ENCOUNTER — Encounter: Payer: Self-pay | Admitting: Physician Assistant

## 2020-08-30 NOTE — Telephone Encounter (Signed)
Looks like referral was denied.

## 2020-09-05 DIAGNOSIS — F4312 Post-traumatic stress disorder, chronic: Secondary | ICD-10-CM | POA: Diagnosis not present

## 2020-09-05 DIAGNOSIS — F331 Major depressive disorder, recurrent, moderate: Secondary | ICD-10-CM | POA: Diagnosis not present

## 2020-09-14 DIAGNOSIS — F331 Major depressive disorder, recurrent, moderate: Secondary | ICD-10-CM | POA: Diagnosis not present

## 2020-09-14 DIAGNOSIS — F4312 Post-traumatic stress disorder, chronic: Secondary | ICD-10-CM | POA: Diagnosis not present

## 2020-09-18 ENCOUNTER — Telehealth: Payer: BC Managed Care – PPO | Admitting: Physician Assistant

## 2020-09-18 DIAGNOSIS — R112 Nausea with vomiting, unspecified: Secondary | ICD-10-CM

## 2020-09-18 MED ORDER — ONDANSETRON HCL 4 MG PO TABS: 4.0000 mg | ORAL_TABLET | Freq: Every day | ORAL | 0 refills | Status: DC | PRN

## 2020-09-18 NOTE — Progress Notes (Signed)
I have spent 5 minutes in review of e-visit questionnaire, review and updating patient chart, medical decision making and response to patient.   Abrielle Finck Cody Inioluwa Baris, PA-C    

## 2020-09-18 NOTE — Progress Notes (Signed)

## 2020-10-04 ENCOUNTER — Encounter: Payer: Self-pay | Admitting: Family Medicine

## 2020-10-04 ENCOUNTER — Other Ambulatory Visit: Payer: Self-pay

## 2020-10-04 ENCOUNTER — Ambulatory Visit (INDEPENDENT_AMBULATORY_CARE_PROVIDER_SITE_OTHER): Payer: BC Managed Care – PPO | Admitting: Family Medicine

## 2020-10-04 VITALS — BP 120/80 | HR 63 | Wt 216.0 lb

## 2020-10-04 DIAGNOSIS — Z3046 Encounter for surveillance of implantable subdermal contraceptive: Secondary | ICD-10-CM | POA: Diagnosis not present

## 2020-10-04 MED ORDER — NORETHIN ACE-ETH ESTRAD-FE 1-20 MG-MCG(24) PO TABS: 1.0000 | ORAL_TABLET | Freq: Every day | ORAL | 3 refills | Status: DC

## 2020-10-04 NOTE — Progress Notes (Signed)
Patient having breakthrough bleeding with nexplanon and became pregnant. Had SAB. Would like to switch to COCs.  Nexplanon Removal:  Patient given informed consent for removal of her Implanon, time out was performed.  Signed copy in the chart.  Appropriate time out taken. Nexplanon site identified.  Area prepped in usual sterile fashon. 30mL of 1% lidocaine was used to anesthetize the area at the distal end of the implant. A small stab incision was made right beside the implant on the distal portion.  The implanon rod was grasped using hemostats and removed without difficulty.  There was less than 3 cc blood loss. There were no complications.  A small amount of antibiotic ointment and steri-strips were applied over the small incision.  A pressure bandage was applied to reduce any bruising.  The patient tolerated the procedure well and was given post procedure instructions.

## 2020-10-04 NOTE — Progress Notes (Signed)
  History:  [name] is a 20 y.o. G1P0010 who presents to clinic today for nexplanon removal. Since having it placed ~3 years ago, she has had irregular menstrual cycles. The first 6 months of Nexplanon she had 6 months of bleeding. She then became pregnant while the Nexplanon was inserted which lead to spontaneous abortion. She has continue to have irregular menstrual cycles and would like to switch to OCPs. She is sexually active with one female partner. She denies vaginal discharge, dysuria or abdominal pain. No chest pain, shortness of breath or dizziness associated with menstrual cycles.   The following portions of the patient's history were reviewed and updated as appropriate: allergies, current medications, family history, past medical history, social history, past surgical history and problem list.  Review of Systems:  Review of Systems  All other systems reviewed and are negative.     Objective:  Physical Exam BP 120/80   Pulse 63   Wt 216 lb (98 kg)   LMP 06/21/2020   BMI 36.50 kg/m  Physical Exam Constitutional:      Appearance: Normal appearance.  Cardiovascular:     Rate and Rhythm: Normal rate and regular rhythm.     Heart sounds: No murmur heard. No friction rub. No gallop.   Pulmonary:     Effort: Pulmonary effort is normal.     Breath sounds: Normal breath sounds.  Abdominal:     General: Bowel sounds are normal.     Palpations: Abdomen is soft.  Musculoskeletal:        General: No swelling.  Neurological:     General: No focal deficit present.     Mental Status: She is alert.  Psychiatric:        Mood and Affect: Mood normal.       Labs and Imaging No results found for this or any previous visit (from the past 24 hour(s)).  No results found.   Assessment & Plan:  This is a 20 y.o. G37P0010 female who presented for Nexplanon removal and discussion of alternative birth control methods due to irregular menses and ineffective contraception.  Options for alternative methods of birth control were discussed. She elected for OCPs. We discussed the benefits and most common associated risks including weight gain, requiring daily tolerance, nausea, headaches and VTE. She does not smoke tobacco. She was prescribed Loestrin and will follow-up.   1. Nexplanon removal - s/p removal, tolerated well   2. Birth Control  - prescribed Loestrin 1-20 mg  - f/u in 3 months to assess tolerance   3. Health Maintenance - due for Pap Smear at next visit    Approximately 30 minutes of total time was spent with this patient on removal of Nexplanon device and discussion of birth control options.   Dot Lanes, Medical Student 10/04/2020 2:28 PM

## 2020-11-17 ENCOUNTER — Telehealth: Payer: BC Managed Care – PPO | Admitting: Emergency Medicine

## 2020-11-17 DIAGNOSIS — N898 Other specified noninflammatory disorders of vagina: Secondary | ICD-10-CM | POA: Diagnosis not present

## 2020-11-17 MED ORDER — FLUCONAZOLE 150 MG PO TABS: 150.0000 mg | ORAL_TABLET | Freq: Once | ORAL | 0 refills | Status: AC

## 2020-11-17 NOTE — Progress Notes (Signed)
Hi [place],  It looks like you are due for your 3 month follow up with [facility] Per a phone note from their office on 11/03/2020: "Please contact our office at [phone] to schedule your 3 month follow up with Dr. Adrian Blackwater."  Please schedule an appointment as soon as possible, if you have not already done so.  I am sending in medication for today but a follow up in-person for recheck of symptoms and potential testing for yeast or other cause of symptoms can be performed at that time.      We are sorry that you are not feeling well. Here is how we plan to help! Based on what you shared with me it looks like you: May have a yeast vaginosis  Vaginosis is an inflammation of the vagina that can result in discharge, itching and pain. The cause is usually a change in the normal balance of vaginal bacteria or an infection. Vaginosis can also result from reduced estrogen levels after menopause.  The most common causes of vaginosis are:   Bacterial vaginosis which results from an overgrowth of one on several organisms that are normally present in your vagina.   Yeast infections which are caused by a naturally occurring fungus called candida.   Vaginal atrophy (atrophic vaginosis) which results from the thinning of the vagina from reduced estrogen levels after menopause.   Trichomoniasis which is caused by a parasite and is commonly transmitted by sexual intercourse.  Factors that increase your risk of developing vaginosis include: Marland Kitchen Medications, such as antibiotics and steroids . Uncontrolled diabetes . Use of hygiene products such as bubble bath, vaginal spray or vaginal deodorant . Douching . Wearing damp or tight-fitting clothing . Using an intrauterine device (IUD) for birth control . Hormonal changes, such as those associated with pregnancy, birth control pills or menopause . Sexual activity . Having a sexually transmitted infection  Your  treatment plan is A single Diflucan (fluconazole) 150mg  tablet once.  I have electronically sent this prescription into the pharmacy that you have chosen.  Be sure to take all of the medication as directed. Stop taking any medication if you develop a rash, tongue swelling or shortness of breath. Mothers who are breast feeding should consider pumping and discarding their breast milk while on these antibiotics. However, there is no consensus that infant exposure at these doses would be harmful.  Remember that medication creams can weaken latex condoms.   HOME CARE:  Good hygiene may prevent some types of vaginosis from recurring and may relieve some symptoms:  . Avoid baths, hot tubs and whirlpool spas. Rinse soap from your outer genital area after a shower, and dry the area well to prevent irritation. Don't use scented or harsh soaps, such as those with deodorant or antibacterial action. Marland Kitchen Avoid irritants. These include scented tampons and pads. . Wipe from front to back after using the toilet. Doing so avoids spreading fecal bacteria to your vagina.  Other things that may help prevent vaginosis include:  Marland Kitchen Don't douche. Your vagina doesn't require cleansing other than normal bathing. Repetitive douching disrupts the normal organisms that reside in the vagina and can actually increase your risk of vaginal infection. Douching won't clear up a vaginal infection. . Use a latex condom. Both female and female latex condoms may help you avoid infections spread by sexual contact. . Wear cotton underwear. Also wear pantyhose with a cotton crotch. If you feel comfortable without  it, skip wearing underwear to bed. Yeast thrives in [employer] Your symptoms should improve in the next day or two.  GET HELP RIGHT AWAY IF:  . You have pain in your lower abdomen ( pelvic area or over your ovaries) . You develop nausea or vomiting . You develop a fever . Your discharge changes or worsens . You have  persistent pain with intercourse . You develop shortness of breath, a rapid pulse, or you faint.  These symptoms could be signs of problems or infections that need to be evaluated by a medical provider now.  MAKE SURE YOU    Understand these instructions.  Will watch your condition.  Will get help right away if you are not doing well or get worse.  Your e-visit answers were reviewed by a board certified advanced clinical practitioner to complete your personal care plan. Depending upon the condition, your plan could have included both over the counter or prescription medications. Please review your pharmacy choice to make sure that you have choses a pharmacy that is open for you to pick up any needed prescription, Your safety is important to [place]. If you have drug allergies check your prescription carefully.   You can use MyChart to ask questions about today's visit, request a non-urgent call back, or ask for a work or school excuse for 24 hours related to this e-Visit. If it has been greater than 24 hours you will need to follow up with your provider, or enter a new e-Visit to address those concerns. You will get a MyChart message within the next two days asking about your experience. I hope that your e-visit has been valuable and will speed your recovery.  Greater than 5 minutes, yet less than 10 minutes of time have been spent researching, coordinating, and implementing care for this patient today.

## 2021-01-13 ENCOUNTER — Encounter: Payer: Self-pay | Admitting: Physician Assistant

## 2021-01-13 ENCOUNTER — Telehealth: Payer: BC Managed Care – PPO | Admitting: Physician Assistant

## 2021-01-13 DIAGNOSIS — H9212 Otorrhea, left ear: Secondary | ICD-10-CM

## 2021-01-13 DIAGNOSIS — H9202 Otalgia, left ear: Secondary | ICD-10-CM

## 2021-01-13 MED ORDER — SULFAMETHOXAZOLE-TRIMETHOPRIM 800-160 MG PO TABS: 1.0000 | ORAL_TABLET | Freq: Two times a day (BID) | ORAL | 0 refills | Status: DC

## 2021-01-13 MED ORDER — NEOMYCIN-POLYMYXIN-HC 3.5-10000-1 OT SOLN: 3.0000 [drp] | Freq: Four times a day (QID) | OTIC | 0 refills | Status: DC

## 2021-01-13 NOTE — Progress Notes (Signed)
E Visit for Swimmer's Ear  We are sorry that you are not feeling well. Here is how we plan to help!  Based on what you have shared with me it looks like you have swimmers ear. Swimmer's ear is a redness or swelling, irritation, or infection of your outer ear canal.  These symptoms usually occur within a few days of swimming.  Your ear canal is a tube that goes from the opening of the ear to the eardrum.  When water stays in your ear canal, germs can grow.  This is a painful condition that often happens to children and swimmers of all ages.  It is not contagious and oral antibiotics are not required to treat uncomplicated swimmer's ear.  The usual symptoms include: Itching inside the ear, Redness or a sense of swelling in the ear, Pain when the ear is tugged on when pressure is placed on the ear, Pus draining from the infected ear. and I have prescribed: Neomycin 0.35%, polymyxin B 10,000 units/mL, and hydrocortisone 0,5% otic solution 4 drops in affected ears four times a day for 7 days  Bactrim 800mg /160mg  one tablet by mouth twice a day for 7 days  Your symptoms of ear pain and drainage, could be related to swimmer's ear, middle ear infection, or possible abscess of the ear. If the prescribed antibiotics dont help, or if you have severe swelling of your your ear, please have a face to face visit for further evaluation.  In certain cases swimmer's ear may progress to a more serious bacterial infection of the middle or inner ear.  If you have a fever 102 and up and significantly worsening symptoms, this could indicate a more serious infection moving to the middle/inner and needs face to face evaluation in an office by a provider.  Your symptoms should improve over the next 3 days and should resolve in about 7 days.  HOME CARE:  Wash your hands frequently. Do not place the tip of the bottle on your ear or touch it with your fingers. You can take Acetominophen 650 mg every 4-6 hours as needed for  pain.  If pain is severe or moderate, you can apply a heating pad (set on low) or hot water bottle (wrapped in a towel) to outer ear for 20 minutes.  This will also increase drainage. Avoid ear plugs Do not use Q-tips After showers, help the water run out by tilting your head to one side.  GET HELP RIGHT AWAY IF:  Fever is over 102.2 degrees. You develop progressive ear pain or hearing loss. Ear symptoms persist longer than 3 days after treatment.  MAKE SURE YOU:  Understand these instructions. Will watch your condition. Will get help right away if you are not doing well or get worse.  TO PREVENT SWIMMER'S EAR: Use a bathing cap or custom fitted swim molds to keep your ears dry. Towel off after swimming to dry your ears. Tilt your head or pull your earlobes to allow the water to escape your ear canal. If there is still water in your ears, consider using a hairdryer on the lowest setting.   Thank you for choosing an e-visit.  Your e-visit answers were reviewed by a board certified advanced clinical practitioner to complete your personal care plan. Depending upon the condition, your plan could have included both over the counter or prescription medications.  Please review your pharmacy choice. Make sure the pharmacy is open so you can pick up prescription now. If there is  a problem, you may contact your provider through [employer] and have the prescription routed to another pharmacy.  Your safety is important to [place]. If you have drug allergies check your prescription carefully.   For the next 24 hours you can use MyChart to ask questions about today's visit, request a non-urgent call back, or ask for a work or school excuse. You will get an email in the next two days asking about your experience. I hope that your e-visit has been valuable and will speed your recovery.  I spent 5-10 minutes on review and completion of this note- Illa Level [facility]

## 2021-01-24 ENCOUNTER — Telehealth: Payer: BC Managed Care – PPO | Admitting: Physician Assistant

## 2021-01-24 DIAGNOSIS — K047 Periapical abscess without sinus: Secondary | ICD-10-CM

## 2021-01-24 DIAGNOSIS — S025XXS Fracture of tooth (traumatic), sequela: Secondary | ICD-10-CM | POA: Diagnosis not present

## 2021-01-24 MED ORDER — AMOXICILLIN 500 MG PO CAPS: 500.0000 mg | ORAL_CAPSULE | Freq: Three times a day (TID) | ORAL | 0 refills | Status: AC

## 2021-01-24 NOTE — Progress Notes (Signed)
I have spent 5 minutes in review of e-visit questionnaire, review and updating patient chart, medical decision making and response to patient.   Needham Biggins Cody Dimitrious Micciche, PA-C    

## 2021-01-24 NOTE — Progress Notes (Signed)

## 2021-03-01 ENCOUNTER — Encounter: Payer: BC Managed Care – PPO | Admitting: Obstetrics and Gynecology

## 2021-03-12 ENCOUNTER — Telehealth: Payer: BC Managed Care – PPO | Admitting: Physician Assistant

## 2021-03-12 DIAGNOSIS — B3731 Acute candidiasis of vulva and vagina: Secondary | ICD-10-CM

## 2021-03-12 DIAGNOSIS — B373 Candidiasis of vulva and vagina: Secondary | ICD-10-CM

## 2021-03-12 MED ORDER — FLUCONAZOLE 150 MG PO TABS: 150.0000 mg | ORAL_TABLET | Freq: Once | ORAL | 0 refills | Status: AC

## 2021-03-12 NOTE — Progress Notes (Signed)

## 2021-03-14 ENCOUNTER — Telehealth: Payer: BC Managed Care – PPO | Admitting: Emergency Medicine

## 2021-03-14 DIAGNOSIS — N941 Unspecified dyspareunia: Secondary | ICD-10-CM

## 2021-03-14 DIAGNOSIS — N898 Other specified noninflammatory disorders of vagina: Secondary | ICD-10-CM

## 2021-03-14 NOTE — Progress Notes (Signed)
Based on what you shared with me, I feel your condition warrants further evaluation and I recommend that you be seen in a face to face visit.  Based on the persistent symptoms you're experiencing and the pain on intercourse, this could be cervicitis.  This is a serious condition that needs to be evaluated in person with a pelvic exam.   NOTE: There will be NO CHARGE for this eVisit   If you are having a true medical emergency please call 911.      For an urgent face to face visit, [place] has six urgent care centers for your convenience:     [facility] Health Ur[facility] Care Center at [facility] Directions [phone] [address] [place], [place] [address]    [facility]) Get Driving Directions [phone] [address] [place], [place] [address]  [facility] Care Center [facility]) Get Driving Directions [phone] [address] [place],  [place]  [address]  [facility] Health Urgent Care at [facility] Get Driving Directions [phone] [address] [place], [place] [address]   [facility] Health Urgent Care at [facility] Get Driving Directions  [phone] [address] [place], [place] [address]   [facility] Health Urgent Care at [facility] Directions [phone] [address], [address] [place], [place] [address]  Your MyChart E-visit questionnaire answers were reviewed by a board certified advanced clinical practitioner to complete your personal care plan based on your specific symptoms.  Thank you for using e-Visits.   Approximately 5 minutes was used in reviewing the patient's chart, questionnaire, prescribing medications, and documentation.

## 2021-03-17 ENCOUNTER — Encounter: Payer: Self-pay | Admitting: Physician Assistant

## 2021-03-17 ENCOUNTER — Telehealth: Payer: BC Managed Care – PPO | Admitting: Physician Assistant

## 2021-03-17 DIAGNOSIS — B373 Candidiasis of vulva and vagina: Secondary | ICD-10-CM

## 2021-03-17 DIAGNOSIS — B3731 Acute candidiasis of vulva and vagina: Secondary | ICD-10-CM

## 2021-03-17 MED ORDER — FLUCONAZOLE 150 MG PO TABS: 150.0000 mg | ORAL_TABLET | Freq: Once | ORAL | 0 refills | Status: AC

## 2021-03-17 NOTE — Progress Notes (Signed)
E-Visit for Vaginal Symptoms  We are sorry that you are not feeling well. Here is how we plan to help! Based on what you shared with me it looks like you: May have a yeast vaginosis  Vaginosis is an inflammation of the vagina that can result in discharge, itching and pain. The cause is usually a change in the normal balance of vaginal bacteria or an infection. Vaginosis can also result from reduced estrogen levels after menopause.  The most common causes of vaginosis are:   Bacterial vaginosis which results from an overgrowth of one on several organisms that are normally present in your vagina.   Yeast infections which are caused by a naturally occurring fungus called candida.   Vaginal atrophy (atrophic vaginosis) which results from the thinning of the vagina from reduced estrogen levels after menopause.   Trichomoniasis which is caused by a parasite and is commonly transmitted by sexual intercourse.  Factors that increase your risk of developing vaginosis include: Medications, such as antibiotics and steroids Uncontrolled diabetes Use of hygiene products such as bubble bath, vaginal spray or vaginal deodorant Douching Wearing damp or tight-fitting clothing Using an intrauterine device (IUD) for birth control Hormonal changes, such as those associated with pregnancy, birth control pills or menopause Sexual activity Having a sexually transmitted infection  Your treatment plan is A single Diflucan (fluconazole) 150mg  tablet once.  I have electronically sent this prescription into the pharmacy that you have chosen.   Ms. Demos,  It appears that you may have had this same symptom recently with additional symptoms such as vaginal pain, if you continue to have vaginal pain, please have a face to face visit for further evaluation as yeast infection should not cause vaginal pain.   Be sure to take all of the medication as directed. Stop taking any medication if you develop a rash, tongue  swelling or shortness of breath. Mothers who are breast feeding should consider pumping and discarding their breast milk while on these antibiotics. However, there is no consensus that infant exposure at these doses would be harmful.  Remember that medication creams can weaken latex condoms. Mickle Mallory   HOME CARE:  Good hygiene may prevent some types of vaginosis from recurring and may relieve some symptoms:  Avoid baths, hot tubs and whirlpool spas. Rinse soap from your outer genital area after a shower, and dry the area well to prevent irritation. Don't use scented or harsh soaps, such as those with deodorant or antibacterial action. Avoid irritants. These include scented tampons and pads. Wipe from front to back after using the toilet. Doing so avoids spreading fecal bacteria to your vagina.  Other things that may help prevent vaginosis include:  Don't douche. Your vagina doesn't require cleansing other than normal bathing. Repetitive douching disrupts the normal organisms that reside in the vagina and can actually increase your risk of vaginal infection. Douching won't clear up a vaginal infection. Use a latex condom. Both female and female latex condoms may help you avoid infections spread by sexual contact. Wear cotton underwear. Also wear pantyhose with a cotton crotch. If you feel comfortable without it, skip wearing underwear to bed. Yeast thrives in [place] Your symptoms should improve in the next day or two.  GET HELP RIGHT AWAY IF:  You have pain in your lower abdomen ( pelvic area or over your ovaries) You develop nausea or vomiting You develop a fever Your discharge changes or worsens You have persistent pain with intercourse You develop shortness of  breath, a rapid pulse, or you faint.  These symptoms could be signs of problems or infections that need to be evaluated by a medical provider now.  MAKE SURE YOU   Understand these instructions. Will watch your  condition. Will get help right away if you are not doing well or get worse.  Thank you for choosing an e-visit.  Your e-visit answers were reviewed by a board certified advanced clinical practitioner to complete your personal care plan. Depending upon the condition, your plan could have included both over the counter or prescription medications.  Please review your pharmacy choice. Make sure the pharmacy is open so you can pick up prescription now. If there is a problem, you may contact your provider through [employer] and have the prescription routed to another pharmacy.  Your safety is important to [place]. If you have drug allergies check your prescription carefully.   For the next 24 hours you can use MyChart to ask questions about today's visit, request a non-urgent call back, or ask for a work or school excuse. You will get an email in the next two days asking about your experience. I hope that your e-visit has been valuable and will speed your recovery. I spent 5-10 minutes on review and completion of this note- Illa Level [facility]

## 2021-03-21 ENCOUNTER — Telehealth: Payer: BC Managed Care – PPO | Admitting: Physician Assistant

## 2021-03-21 DIAGNOSIS — L309 Dermatitis, unspecified: Secondary | ICD-10-CM | POA: Diagnosis not present

## 2021-03-21 MED ORDER — TRIAMCINOLONE ACETONIDE 0.1 % EX CREA: 1.0000 "application " | TOPICAL_CREAM | Freq: Two times a day (BID) | CUTANEOUS | 0 refills | Status: DC

## 2021-03-21 NOTE — Progress Notes (Signed)
E Visit for Rash  We are sorry that you are not feeling well. Here is how we plan to help!  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 I am prescribing a steroid cream to apply twice daily for up to two weeks to help. Keep skin moisturized with a non-scented and non-dyed lotion like Cerave or Cetaphil. Try to cut down temperature of shower water as well. You need a follow-up with your primary care provider as this issue can be recurrent.    HOME CARE:  Take cool showers and avoid direct sunlight. Apply cool compress or wet dressings. Take a bath in an oatmeal bath.  Sprinkle content of one Aveeno packet under running faucet with comfortably warm water.  Bathe for 15-20 minutes, 1-2 times daily.  Pat dry with a towel. Do not rub the rash. Use hydrocortisone cream. Take an antihistamine like Benadryl for widespread rashes that itch.  The adult dose of Benadryl is 25-50 mg by mouth 4 times daily. Caution:  This type of medication may cause sleepiness.  Do not drink alcohol, drive, or operate dangerous machinery while taking antihistamines.  Do not take these medications if you have prostate enlargement.  Read package instructions thoroughly on all medications that you take.  GET HELP RIGHT AWAY IF:  Symptoms don't go away after treatment. Severe itching that persists. If you rash spreads or swells. If you rash begins to smell. If it blisters and opens or develops a yellow-brown crust. You develop a fever. You have a sore throat. You become short of breath.  MAKE SURE YOU:  Understand these instructions. Will watch your condition. Will get help right away if you are not doing well or get worse.  Thank you for choosing an e-visit.  Your e-visit answers were reviewed by a board certified advanced clinical practitioner to complete your personal care plan. Depending upon the condition, your plan could have included both over the counter or prescription medications.  Please review your  pharmacy choice. Make sure the pharmacy is open so you can pick up prescription now. If there is a problem, you may contact your provider through [employer] and have the prescription routed to another pharmacy.  Your safety is important to [place]. If you have drug allergies check your prescription carefully.   For the next 24 hours you can use MyChart to ask questions about today's visit, request a non-urgent call back, or ask for a work or school excuse. You will get an email in the next two days asking about your experience. I hope that your e-visit has been valuable and will speed your recovery.

## 2021-03-21 NOTE — Progress Notes (Signed)
I have spent 5 minutes in review of e-visit questionnaire, review and updating patient chart, medical decision making and response to patient.   Gessica Jawad Cody Doyle Tegethoff, PA-C    

## 2021-03-26 ENCOUNTER — Telehealth: Payer: BC Managed Care – PPO | Admitting: Physician Assistant

## 2021-03-26 DIAGNOSIS — G8929 Other chronic pain: Secondary | ICD-10-CM

## 2021-03-26 DIAGNOSIS — K089 Disorder of teeth and supporting structures, unspecified: Secondary | ICD-10-CM

## 2021-03-26 NOTE — Progress Notes (Signed)
Based on what you shared with me, I feel your condition warrants further evaluation and I recommend that you be seen in a face to face visit.  Giving recurrence of dental infections and ongoing dental pain you need in-person assessment. If you are unable to get in with your dental provider then I recommend you contact your PCP or be seen in-person at one of our local Urgent Care facilities.    NOTE: There will be NO CHARGE for this eVisit   If you are having a true medical emergency please call 911.      For an urgent face to face visit, [place] has six urgent care centers for your convenience:     [facility] Health Urgent Care Center at [facility] Directions [phone] [address] [place], [place] [address]    [facility] Health Urgent Care Center [facility]) Get Driving Directions [phone] [address] [place], [place] [address]  [facility] Health Urgent Care Center [facility]) Get Driving Directions [phone] [address] [place],  [place]  [address]  [facility] Health Urgent Care at [facility] Get Driving Directions [phone] [address] [place], [place] [address]   [facility] Health Urgent Care at [facility] Get Driving Directions  [phone] [address] [place], [place] [address]   [facility] Health Urgent Care at [facility] Directions [phone] [address] [place], [place] [address]  Your MyChart E-visit questionnaire answers were reviewed by a board certified advanced clinical practitioner to complete your personal care plan based on your specific symptoms.  Thank you for using e-Visits.

## 2021-03-28 ENCOUNTER — Other Ambulatory Visit: Payer: Self-pay | Admitting: Physician Assistant

## 2021-03-28 ENCOUNTER — Encounter: Payer: Self-pay | Admitting: Physician Assistant

## 2021-03-28 ENCOUNTER — Other Ambulatory Visit: Payer: Self-pay

## 2021-03-28 MED ORDER — ALBUTEROL SULFATE HFA 108 (90 BASE) MCG/ACT IN AERS: INHALATION_SPRAY | RESPIRATORY_TRACT | 2 refills | Status: DC

## 2021-03-28 MED ORDER — FLUTICASONE PROPIONATE HFA 44 MCG/ACT IN AERO: 1.0000 | INHALATION_SPRAY | Freq: Two times a day (BID) | RESPIRATORY_TRACT | 12 refills | Status: DC

## 2021-04-05 ENCOUNTER — Other Ambulatory Visit: Payer: Self-pay | Admitting: *Deleted

## 2021-04-05 MED ORDER — FLUTICASONE PROPIONATE (INHAL) 100 MCG/BLIST IN AEPB: 1.0000 | INHALATION_SPRAY | Freq: Two times a day (BID) | RESPIRATORY_TRACT | 1 refills | Status: DC

## 2021-04-12 ENCOUNTER — Telehealth: Payer: BC Managed Care – PPO | Admitting: Physician Assistant

## 2021-04-12 DIAGNOSIS — J302 Other seasonal allergic rhinitis: Secondary | ICD-10-CM

## 2021-04-12 NOTE — Progress Notes (Signed)
E visit for Allergic Rhinitis We are sorry that you are not feeling well.  Here is how we plan to help!  Based on what you have shared with me it looks like you have Allergic Rhinitis.  Rhinitis is when a reaction occurs that causes nasal congestion, runny nose, sneezing, and itching.  Most types of rhinitis are caused by an inflammation and are associated with symptoms in the eyes ears or throat. There are several types of rhinitis.  The most common are acute rhinitis, which is usually caused by a viral illness, allergic or seasonal rhinitis, and nonallergic or year-round rhinitis.  Nasal allergies occur certain times of the year.  Allergic rhinitis is caused when allergens in the air trigger the release of histamine in the body.  Histamine causes itching, swelling, and fluid to build up in the fragile linings of the nasal passages, sinuses and eyelids.  An itchy nose and clear discharge are common.  I recommend the following over the counter treatments: You should take a daily dose of antihistamine and Xyzal 5 mg take 1 tablet daily -- this is good because it is non-drowsy and lasts all day long. This is OTC.  I also would recommend a nasal spray: Saline 1 spray into each nostril as needed. You can continue the Nasacort as well with this.   You may also benefit from eye drops such as: Systane 1-2 driops each eye twice daily as needed  HOME CARE:  You can use an over-the-counter saline nasal spray as needed Avoid areas where there is heavy dust, mites, or molds Stay indoors on windy days during the pollen season Keep windows closed in home, at least in bedroom; use air conditioner. Use high-efficiency house air filter Keep windows closed in car, turn AC on re-circulate Avoid playing out with dog during pollen season  GET HELP RIGHT AWAY IF:  If your symptoms do not improve within 10 days You become short of breath You develop yellow or green discharge from your nose for over 3 days You  have coughing fits  MAKE SURE YOU:  Understand these instructions Will watch your condition Will get help right away if you are not doing well or get worse  Thank you for choosing an e-visit. Your e-visit answers were reviewed by a board certified advanced clinical practitioner to complete your personal care plan. Depending upon the condition, your plan could have included both over the counter or prescription medications. Please review your pharmacy choice. Be sure that the pharmacy you have chosen is open so that you can pick up your prescription now.  If there is a problem you may message your provider in MyChart to have the prescription routed to another pharmacy. Your safety is important to [place]. If you have drug allergies check your prescription carefully.  For the next 24 hours, you can use MyChart to ask questions about today's visit, request a non-urgent call back, or ask for a work or school excuse from your e-visit provider. You will get an email in the next two days asking about your experience. I hope that your e-visit has been valuable and will speed your recovery.

## 2021-04-12 NOTE — Progress Notes (Signed)
I have spent 5 minutes in review of e-visit questionnaire, review and updating patient chart, medical decision making and response to patient.   Jeweldean Drohan Cody Quince Santana, PA-C    

## 2021-04-13 ENCOUNTER — Encounter: Payer: Self-pay | Admitting: Physician Assistant

## 2021-04-14 ENCOUNTER — Telehealth: Payer: BC Managed Care – PPO | Admitting: Nurse Practitioner

## 2021-04-14 DIAGNOSIS — R0989 Other specified symptoms and signs involving the circulatory and respiratory systems: Secondary | ICD-10-CM

## 2021-04-14 DIAGNOSIS — R051 Acute cough: Secondary | ICD-10-CM

## 2021-04-14 DIAGNOSIS — J029 Acute pharyngitis, unspecified: Secondary | ICD-10-CM

## 2021-04-14 MED ORDER — PSEUDOEPH-BROMPHEN-DM 30-2-10 MG/5ML PO SYRP: 5.0000 mL | ORAL_SOLUTION | Freq: Four times a day (QID) | ORAL | 0 refills | Status: AC | PRN

## 2021-04-14 NOTE — Progress Notes (Signed)
E-Visit for Upper Respiratory Infection   We are sorry you are not feeling well.  Here is how we plan to help!  Based on what you have shared with me, it looks like you may have a viral upper respiratory infection.  Upper respiratory infections are caused by a large number of viruses; however, rhinovirus is the most common cause.   Symptoms vary from person to person, with common symptoms including sore throat, cough, fatigue or lack of energy and feeling of general discomfort.  A low-grade fever of up to 100.4 may present, but is often uncommon.  Symptoms vary however, and are closely related to a person's age or underlying illnesses.  The most common symptoms associated with an upper respiratory infection are nasal discharge or congestion, cough, sneezing, headache and pressure in the ears and face.  These symptoms usually persist for about 3 to 10 days, but can last up to 2 weeks.  It is important to know that upper respiratory infections do not cause serious illness or complications in most cases.    Upper respiratory infections can be transmitted from person to person, with the most common method of transmission being a person's hands.  The virus is able to live on the skin and can infect other persons for up to 2 hours after direct contact.  Also, these can be transmitted when someone coughs or sneezes; thus, it is important to cover the mouth to reduce this risk.  To keep the spread of the illness at bay, good hand hygiene is very important.  This is an infection that is most likely caused by a virus. There are no specific treatments other than to help you with the symptoms until the infection runs its course.  We are sorry you are not feeling well.  Here is how we plan to help!   For nasal congestion, you may use an oral decongestants such as Mucinex D or if you have glaucoma or high blood pressure use plain Mucinex.  Saline nasal spray or nasal drops can help and can safely be used as often as  needed for congestion.  For your congestion, I recommend you continue the  Fluticasone nasal spray one spray in each nostril twice a day previously prescribed. If you do not have a history of heart disease, hypertension, diabetes or thyroid disease, prostate/bladder issues or glaucoma, you may also use Sudafed to treat nasal congestion.  It is highly recommended that you consult with a pharmacist or your primary care physician to ensure this medication is safe for you to take.     If you have a cough, you may use cough suppressants such as Delsym and Robitussin.  If you have glaucoma or high blood pressure, you can also use Coricidin HBP.   For cough I have prescribed for you Bromfed DM cough syrup. Take 48mL every 6 hours as needed for cough. To help with the cough at night, use a humidifier in your room and sleep elevated using 2 pillows.   If you have a sore or scratchy throat, use a saltwater gargle-  to  teaspoon of salt dissolved in a 4-ounce to 8-ounce glass of warm water.  Gargle the solution for approximately 15-30 seconds and then spit.  It is important not to swallow the solution.  You can also use throat lozenges/cough drops and Chloraseptic spray to help with throat pain or discomfort.  Warm or cold liquids can also be helpful in relieving throat pain.  For headache, pain  or general discomfort, you can use Ibuprofen or Tylenol as directed.   Some authorities believe that zinc sprays or the use of Echinacea may shorten the course of your symptoms.  Please be aware that viral symptoms can last last up to 14 days. I recommend symptomatic treatment at time, as prescribing antibiotics inappropriately put you at risk for long-term complications.   HOME CARE Only take medications as instructed by your medical team. Be sure to drink plenty of fluids. Water is fine as well as fruit juices, sodas and electrolyte beverages. You may want to stay away from caffeine or alcohol. If you are nauseated,  try taking small sips of liquids. How do you know if you are getting enough fluid? Your urine should be a pale yellow or almost colorless. Get rest. Taking a steamy shower or using a humidifier may help nasal congestion and ease sore throat pain. You can place a towel over your head and breathe in the steam from hot water coming from a faucet. Using a saline nasal spray works much the same way. Cough drops, hard candies and sore throat lozenges may ease your cough. Avoid close contacts especially the very young and the elderly Cover your mouth if you cough or sneeze Always remember to wash your hands.   GET HELP RIGHT AWAY IF: You develop worsening fever. If your symptoms do not improve within 10 days You develop yellow or green discharge from your nose over 3 days. You have coughing fits You develop a severe head ache or visual changes. You develop shortness of breath, difficulty breathing or start having chest pain Your symptoms persist after you have completed your treatment plan  MAKE SURE YOU  Understand these instructions. Will watch your condition. Will get help right away if you are not doing well or get worse.  Thank you for choosing an e-visit.  Your e-visit answers were reviewed by a board certified advanced clinical practitioner to complete your personal care plan. Depending upon the condition, your plan could have included both over the counter or prescription medications.  Please review your pharmacy choice. Make sure the pharmacy is open so you can pick up prescription now. If there is a problem, you may contact your provider through [employer] and have the prescription routed to another pharmacy.  Your safety is important to [place]. If you have drug allergies check your prescription carefully.   For the next 24 hours you can use MyChart to ask questions about today's visit, request a non-urgent call back, or ask for a work or school excuse. You will get an email in  the next two days asking about your experience. I hope that your e-visit has been valuable and will speed your recovery.  I have spent at least 5 minutes reviewing and documenting in the patient's chart.

## 2021-04-18 ENCOUNTER — Emergency Department (HOSPITAL_COMMUNITY): Payer: BC Managed Care – PPO

## 2021-04-18 ENCOUNTER — Emergency Department (HOSPITAL_COMMUNITY)
Admission: EM | Admit: 2021-04-18 | Discharge: 2021-04-18 | Disposition: A | Discharge status: Home / Self Care | Payer: BC Managed Care – PPO | Attending: Emergency Medicine | Admitting: Emergency Medicine

## 2021-04-18 ENCOUNTER — Other Ambulatory Visit: Payer: Self-pay

## 2021-04-18 ENCOUNTER — Encounter (HOSPITAL_COMMUNITY): Payer: Self-pay | Admitting: Oncology

## 2021-04-18 DIAGNOSIS — Z7951 Long term (current) use of inhaled steroids: Secondary | ICD-10-CM | POA: Insufficient documentation

## 2021-04-18 DIAGNOSIS — R509 Fever, unspecified: Secondary | ICD-10-CM | POA: Diagnosis not present

## 2021-04-18 DIAGNOSIS — Z20822 Contact with and (suspected) exposure to covid-19: Secondary | ICD-10-CM | POA: Diagnosis not present

## 2021-04-18 DIAGNOSIS — R1030 Lower abdominal pain, unspecified: Secondary | ICD-10-CM | POA: Diagnosis not present

## 2021-04-18 DIAGNOSIS — N9489 Other specified conditions associated with female genital organs and menstrual cycle: Secondary | ICD-10-CM | POA: Insufficient documentation

## 2021-04-18 DIAGNOSIS — J45909 Unspecified asthma, uncomplicated: Secondary | ICD-10-CM | POA: Diagnosis not present

## 2021-04-18 DIAGNOSIS — R112 Nausea with vomiting, unspecified: Secondary | ICD-10-CM | POA: Diagnosis not present

## 2021-04-18 DIAGNOSIS — E860 Dehydration: Secondary | ICD-10-CM | POA: Diagnosis not present

## 2021-04-18 DIAGNOSIS — R197 Diarrhea, unspecified: Secondary | ICD-10-CM | POA: Insufficient documentation

## 2021-04-18 DIAGNOSIS — J40 Bronchitis, not specified as acute or chronic: Secondary | ICD-10-CM | POA: Insufficient documentation

## 2021-04-18 DIAGNOSIS — R059 Cough, unspecified: Secondary | ICD-10-CM | POA: Diagnosis not present

## 2021-04-18 LAB — URINALYSIS, ROUTINE W REFLEX MICROSCOPIC
Bacteria, UA: NONE SEEN
Bilirubin Urine: NEGATIVE
Glucose, UA: NEGATIVE mg/dL
Hgb urine dipstick: NEGATIVE
Ketones, ur: 20 mg/dL — AB
Nitrite: NEGATIVE
Protein, ur: 100 mg/dL — AB
Specific Gravity, Urine: 1.029 (ref 1.005–1.030)
pH: 7 (ref 5.0–8.0)

## 2021-04-18 LAB — CBC
HCT: 40.2 % (ref 36.0–46.0)
Hemoglobin: 13.2 g/dL (ref 12.0–15.0)
MCH: 27.7 pg (ref 26.0–34.0)
MCHC: 32.8 g/dL (ref 30.0–36.0)
MCV: 84.5 fL (ref 80.0–100.0)
Platelets: 260 10*3/uL (ref 150–400)
RBC: 4.76 MIL/uL (ref 3.87–5.11)
RDW: 12.1 % (ref 11.5–15.5)
WBC: 7.3 10*3/uL (ref 4.0–10.5)
nRBC: 0 % (ref 0.0–0.2)

## 2021-04-18 LAB — RESP PANEL BY RT-PCR (FLU A&B, COVID) ARPGX2
Influenza A by PCR: NEGATIVE
Influenza B by PCR: NEGATIVE
SARS Coronavirus 2 by RT PCR: NEGATIVE

## 2021-04-18 LAB — COMPREHENSIVE METABOLIC PANEL
ALT: 19 U/L (ref 0–44)
AST: 22 U/L (ref 15–41)
Albumin: 5.1 g/dL — ABNORMAL HIGH (ref 3.5–5.0)
Alkaline Phosphatase: 106 U/L (ref 38–126)
Anion gap: 9 (ref 5–15)
BUN: 10 mg/dL (ref 6–20)
CO2: 24 mmol/L (ref 22–32)
Calcium: 9.9 mg/dL (ref 8.9–10.3)
Chloride: 106 mmol/L (ref 98–111)
Creatinine, Ser: 0.66 mg/dL (ref 0.44–1.00)
GFR, Estimated: >60 mL/min (ref >60)
Glucose, Bld: 116 mg/dL — ABNORMAL HIGH (ref 70–99)
Potassium: 3.5 mmol/L (ref 3.5–5.1)
Sodium: 139 mmol/L (ref 135–145)
Total Bilirubin: 0.4 mg/dL (ref 0.3–1.2)
Total Protein: 8.8 g/dL — ABNORMAL HIGH (ref 6.5–8.1)

## 2021-04-18 LAB — I-STAT BETA HCG BLOOD, ED (MC, WL, AP ONLY): I-stat hCG, quantitative: <5 m[IU]/mL (ref <5)

## 2021-04-18 LAB — LIPASE, BLOOD: Lipase: 27 U/L (ref 11–51)

## 2021-04-18 MED ORDER — ONDANSETRON 4 MG PO TBDP
4.0000 mg | ORAL_TABLET | Freq: Three times a day (TID) | ORAL | 0 refills | Status: DC | PRN
Start: 2021-04-18 — End: 2021-11-13

## 2021-04-18 MED ORDER — AZITHROMYCIN 250 MG PO TABS: ORAL_TABLET | ORAL | 0 refills | Status: DC

## 2021-04-18 MED ORDER — SODIUM CHLORIDE 0.9 % IV BOLUS
1000.0000 mL | Freq: Once | INTRAVENOUS | Status: AC
  Administered 2021-04-18: 1000 mL via INTRAVENOUS

## 2021-04-18 MED ORDER — ONDANSETRON 4 MG PO TBDP
4.0000 mg | ORAL_TABLET | Freq: Once | ORAL | Status: AC | PRN
  Administered 2021-04-18: 4 mg via ORAL
  Filled 2021-04-18: qty 1

## 2021-04-18 MED ORDER — MORPHINE SULFATE (PF) 4 MG/ML IV SOLN
4.0000 mg | Freq: Once | INTRAVENOUS | Status: AC
  Administered 2021-04-18: 4 mg via INTRAVENOUS
  Filled 2021-04-18: qty 1

## 2021-04-18 MED ORDER — ONDANSETRON HCL 4 MG/2ML IJ SOLN
4.0000 mg | Freq: Once | INTRAMUSCULAR | Status: AC
  Administered 2021-04-18: 4 mg via INTRAVENOUS
  Filled 2021-04-18: qty 2

## 2021-04-18 NOTE — ED Provider Notes (Signed)
[facility]-EMERGENCY DEPT Provider Note   CSN: [id number] Arrival date & time: 04/18/21  1712     History Chief Complaint  Patient presents with   Emesis    Emily Foster is a 20 y.o. female.  Pt presents to the ED today with n/v and abdominal pain.  Pt said sx started this am around 0600.  She has pain across her lower abdomen.  Pt said she's had a fever on and off for a week.  She has been coughing up brown phlegm.  Pt said she did an e-visit with her doctor, but was told it was a URI.  She does not feel that is getting better.        Past Medical History:  Diagnosis Date   Allergy    Asthma    UTI (urinary tract infection)     Patient Active Problem List   Diagnosis Date Noted   Yeast vaginitis 01/30/2017   Chlamydia 01/30/2017    Past Surgical History:  Procedure Laterality Date   TOE SURGERY       OB History     Gravida  1   Para  0   Term  0   Preterm  0   AB  1   Living  0      SAB  1   IAB  0   Ectopic  0   Multiple  0   Live Births  0           Family History  Problem Relation Age of Onset   Diabetes Mother    Diabetes Paternal Grandmother    Diabetes Paternal Grandfather     Social History   Tobacco Use   Smoking status: Never   Smokeless tobacco: Never  Vaping Use   Vaping Use: Every day  Substance Use Topics   Alcohol use: No   Drug use: No    Home Medications Prior to Admission medications   Medication Sig Start Date End Date Taking? Authorizing Provider  azithromycin (ZITHROMAX Z-PAK) 250 MG tablet Take 2 tablets on the first day, then 1 tablet for the next 4 days. 04/18/21  Yes Jacalyn Lefevre, MD  ondansetron (ZOFRAN ODT) 4 MG disintegrating tablet Take 1 tablet (4 mg total) by mouth every 8 (eight) hours as needed for nausea or vomiting. 04/18/21  Yes Jacalyn Lefevre, MD  acetaminophen (TYLENOL) 500 MG tablet Take 1,000 mg by mouth every 6 (six) hours as needed.    [provider]  albuterol (VENTOLIN HFA) 108 (90 Base) MCG/ACT inhaler TAKE 2 PUFFS BY MOUTH EVERY 6 HOURS AS NEEDED FOR WHEEZE OR SHORTNESS OF BREATH 03/28/21   Jarold Motto, PA  Ascorbic Acid (VITAMIN C) 100 MG CHEW Chew 1 each by mouth daily in the afternoon.    [provider]  brompheniramine-pseudoephedrine-DM 30-2-10 MG/5ML syrup Take 5 mLs by mouth 4 (four) times daily as needed for up to 7 days. 04/14/21 04/21/21  Benay Pike, NP  Cholecalciferol (VITAMIN D3) 125 MCG (5000 UT) CAPS Take 1 capsule by mouth daily in the afternoon.    [provider]  Cyanocobalamin (VITAMIN B12 PO) Take 1 each by mouth daily in the afternoon.    [provider]  etonogestrel (NEXPLANON) 68 MG IMPL implant 1 each by Subdermal route once. Inserted 2019    [provider]  Fluticasone Propionate, Inhal, 100 MCG/BLIST AEPB Inhale 1 puff into the lungs 2 (two) times daily. 04/05/21   Jarold Motto,  PA  Norethindrone Acetate-Ethinyl Estrad-FE (LOESTRIN 24 FE) 1-20 MG-MCG(24) tablet Take 1 tablet by mouth daily. 10/04/20   Levie Heritage, DO  ondansetron (ZOFRAN) 4 MG tablet Take 1 tablet (4 mg total) by mouth daily as needed for nausea or vomiting. 09/18/20 09/18/21  Waldon Merl, PA-C  triamcinolone cream (KENALOG) 0.1 % Apply 1 application topically 2 (two) times daily. 03/21/21   Waldon Merl, PA-C    Allergies    Other and Soy allergy  Review of Systems   Review of Systems  Constitutional:  Positive for fever.  Respiratory:  Positive for cough.   Gastrointestinal:  Positive for abdominal pain, nausea and vomiting.  All other systems reviewed and are negative.  Physical Exam Updated Vital Signs BP 107/63   Pulse (!) 58   Temp 98.1 F (36.7 C) (Oral)   Resp 19   Ht 5' 4.5" (1.638 m)   Wt 99.8 kg   LMP 04/18/2021 (Exact Date)   SpO2 100%   BMI 37.18 kg/m   Physical Exam Vitals and nursing note reviewed.  Constitutional:       Appearance: Normal appearance.  HENT:     Head: Normocephalic and atraumatic.     Right Ear: External ear normal.     Left Ear: External ear normal.     Nose: Nose normal.     Mouth/Throat:     Mouth: Mucous membranes are dry.  Eyes:     Extraocular Movements: Extraocular movements intact.     Conjunctiva/sclera: Conjunctivae normal.     Pupils: Pupils are equal, round, and reactive to light.  Cardiovascular:     Rate and Rhythm: Normal rate and regular rhythm.  Pulmonary:     Effort: Pulmonary effort is normal.     Breath sounds: Normal breath sounds.  Abdominal:     General: Abdomen is flat. Bowel sounds are normal.     Palpations: Abdomen is soft.     Tenderness: There is abdominal tenderness in the right lower quadrant, suprapubic area and left lower quadrant.  Musculoskeletal:        General: Normal range of motion.     Cervical back: Normal range of motion and neck supple.  Skin:    General: Skin is warm.     Capillary Refill: Capillary refill takes less than 2 seconds.  Neurological:     General: No focal deficit present.     Mental Status: She is alert and oriented to person, place, and time.  Psychiatric:        Mood and Affect: Mood normal.        Behavior: Behavior normal.    ED Results / Procedures / Treatments   Labs (all labs ordered are listed, but only abnormal results are displayed) Labs Reviewed  COMPREHENSIVE METABOLIC PANEL - Abnormal; Notable for the following components:      Result Value   Glucose, Bld 116 (*)    Total Protein 8.8 (*)    Albumin 5.1 (*)    All other components within normal limits  URINALYSIS, ROUTINE W REFLEX MICROSCOPIC - Abnormal; Notable for the following components:   Ketones, ur 20 (*)    Protein, ur 100 (*)    Leukocytes,Ua SMALL (*)    All other components within normal limits  RESP PANEL BY RT-PCR (FLU A&B, COVID) ARPGX2  LIPASE, BLOOD  CBC  I-STAT BETA HCG BLOOD, ED ([facility], [facility], [facility] ONLY)     EKG None  Radiology DG Chest Portable 1  View  Result Date: 04/18/2021 CLINICAL DATA:  Cough EXAM: PORTABLE CHEST 1 VIEW COMPARISON:  07/13/2020 FINDINGS: The heart size and mediastinal contours are within normal limits. Both lungs are clear. The visualized skeletal structures are unremarkable. IMPRESSION: Normal study. Electronically Signed   By: Charlett Nose M.D.   On: 04/18/2021 19:35    Procedures Procedures   Medications Ordered in ED Medications  ondansetron (ZOFRAN-ODT) disintegrating tablet 4 mg (4 mg Oral Given 04/18/21 1812)  sodium chloride 0.9 % bolus 1,000 mL (0 mLs Intravenous Stopped 04/18/21 2007)  ondansetron (ZOFRAN) injection 4 mg (4 mg Intravenous Given 04/18/21 1852)  morphine 4 MG/ML injection 4 mg (4 mg Intravenous Given 04/18/21 1853)    ED Course  I have reviewed the triage vital signs and the nursing notes.  Pertinent labs & imaging results that were available during my care of the patient were reviewed by me and considered in my medical decision making (see chart for details).    MDM Rules/Calculators/A&P                           Pt is feeling much better.  Labs are unremarkable.  The pt has had a cough for 2 weeks, so I will put her on zithromax.  Pt is stable for d/c.  She is to return if worse.  Emily Sanpedro was evaluated in Emergency Department on 04/18/2021 for the symptoms described in the history of present illness. She was evaluated in the context of the global COVID-19 pandemic, which necessitated consideration that the patient might be at risk for infection with the SARS-CoV-2 virus that causes COVID-19. Institutional protocols and algorithms that pertain to the evaluation of patients at risk for COVID-19 are in a state of rapid change based on information released by regulatory bodies including the CDC and federal and state organizations. These policies and algorithms were followed during the patient's care in the ED.  Final Clinical  Impression(s) / ED Diagnoses Final diagnoses:  Dehydration  Bronchitis  Nausea vomiting and diarrhea    Rx / DC Orders ED Discharge Orders          Ordered    ondansetron (ZOFRAN ODT) 4 MG disintegrating tablet  Every 8 hours PRN        04/18/21 2036    azithromycin (ZITHROMAX Z-PAK) 250 MG tablet        04/18/21 2036             Jacalyn Lefevre, MD 04/18/21 2038

## 2021-04-18 NOTE — ED Triage Notes (Signed)
Pt presents d/t abdominal pain, N/V since 0600 this am.

## 2021-04-20 ENCOUNTER — Encounter: Payer: Self-pay | Admitting: Physician Assistant

## 2021-04-20 NOTE — Telephone Encounter (Signed)
Please see message and advise 

## 2021-05-19 ENCOUNTER — Encounter (HOSPITAL_COMMUNITY): Payer: Self-pay | Admitting: Emergency Medicine

## 2021-05-19 ENCOUNTER — Other Ambulatory Visit: Payer: Self-pay

## 2021-05-19 ENCOUNTER — Inpatient Hospital Stay (HOSPITAL_COMMUNITY)
Admission: AD | Admit: 2021-05-19 | Discharge: 2021-05-20 | Disposition: A | Discharge status: Home / Self Care | Payer: BC Managed Care – PPO | Attending: Family Medicine | Admitting: Family Medicine

## 2021-05-19 DIAGNOSIS — O3680X0 Pregnancy with inconclusive fetal viability, not applicable or unspecified: Secondary | ICD-10-CM

## 2021-05-19 DIAGNOSIS — R1030 Lower abdominal pain, unspecified: Secondary | ICD-10-CM | POA: Insufficient documentation

## 2021-05-19 DIAGNOSIS — Z3A01 Less than 8 weeks gestation of pregnancy: Secondary | ICD-10-CM | POA: Insufficient documentation

## 2021-05-19 DIAGNOSIS — O26891 Other specified pregnancy related conditions, first trimester: Secondary | ICD-10-CM | POA: Insufficient documentation

## 2021-05-19 DIAGNOSIS — O26859 Spotting complicating pregnancy, unspecified trimester: Secondary | ICD-10-CM | POA: Diagnosis not present

## 2021-05-19 DIAGNOSIS — O209 Hemorrhage in early pregnancy, unspecified: Secondary | ICD-10-CM | POA: Diagnosis not present

## 2021-05-19 DIAGNOSIS — O4690 Antepartum hemorrhage, unspecified, unspecified trimester: Secondary | ICD-10-CM

## 2021-05-19 DIAGNOSIS — O0091 Unspecified ectopic pregnancy with intrauterine pregnancy: Secondary | ICD-10-CM | POA: Diagnosis not present

## 2021-05-19 LAB — I-STAT BETA HCG BLOOD, ED (MC, WL, AP ONLY): I-stat hCG, quantitative: 10.8 m[IU]/mL — ABNORMAL HIGH (ref <5)

## 2021-05-19 LAB — ABO/RH: ABO/RH(D): AB POS

## 2021-05-19 LAB — WET PREP, GENITAL
Sperm: NONE SEEN
Trich, Wet Prep: NONE SEEN
WBC, Wet Prep HPF POC: <10 (ref <10)
Yeast Wet Prep HPF POC: NONE SEEN

## 2021-05-19 MED ORDER — ACETAMINOPHEN 500 MG PO TABS
1000.0000 mg | ORAL_TABLET | Freq: Once | ORAL | Status: AC
  Administered 2021-05-19: 1000 mg via ORAL
  Filled 2021-05-19: qty 2

## 2021-05-19 NOTE — MAU Note (Signed)
..  Emily Foster is a 20 y.o. at Unknown here in MAU reporting: cramping and vaginal bleeding that began this morning. Reports the bleeding is only when she wipes. Has not taken anything for the cramping.  LMP: 04/18/2021  Pain score: 7/10 Vitals:   05/19/21 2026  BP: 114/71  Pulse: 87  Resp: 16  Temp: 99.4 F (37.4 C)  SpO2: 100%

## 2021-05-19 NOTE — ED Provider Notes (Signed)
Emergency Medicine Provider OB Triage Evaluation Note  Emily Foster is a 20 y.o. female, G5 P21, at Unknown gestation who presents to the emergency department with complaints of vaginal bleeding and abdominal cramping.  Patient reports that her symptoms started earlier today.  Patient describes bleeding as spotting.  States that she passed "1 large blood clot."  Patient reports that she has had cramping to her lower abdomen.  Review of  Systems  Positive: Vaginal bleeding, abdominal cramping Negative: Lightheadedness, dizziness, syncope  Physical Exam  BP 114/71 (BP Location: Left Arm)   Pulse 87   Temp 99.4 F (37.4 C) (Oral)   Resp 16   LMP 04/18/2021   SpO2 100%  General: Awake, no distress  HEENT: Atraumatic  Resp: Normal effort  Cardiac: Normal rate Abd: Nondistended, mild tenderness to lower abdomen MSK: Moves all extremities without difficulty Neuro: Speech clear  Medical Decision Making  Pt evaluated for pregnancy concern and is stable for transfer to MAU. Pt is in agreement with plan for transfer.  9:41 PM Discussed with MAU APP, Emily Foster, who accepts patient in transfer.  Clinical Impression  No diagnosis found.     Haskel Schroeder, PA-C 05/19/21 2141    Charlynne Pander, MD 05/20/21 2322

## 2021-05-19 NOTE — Discharge Instructions (Signed)

## 2021-05-19 NOTE — MAU Provider Note (Signed)
History     CSN: [id number]  Arrival date and time: 05/19/21 1950   Event Date/Time   First Provider Initiated Contact with Patient 05/19/21 2249      Chief Complaint  Patient presents with   [redacted] Weeks Pregnant    Vag. Bleeding/Cramping   Emily Foster is a 20 y.o. G2P0010 at [redacted]w[redacted]d by LMP who has received care from [facility].  She presents today after transfer from [facility] for vaginal bleeding and abdominal cramping.  Patient reports symptoms started this morning around 0800.  She describes the bleeding as pink that is noted with wiping.  She she states it has been consistent throughout the day.  She also reports cramping in the lower abdomen, that improves with walking and is worse with sitting.  She rates the pain a 10/10.  She reports recent sexual activity, but no pain or discomfort.  She also denies pain or discomfort with urination or nausea or vomiting.  She does report some vaginal discharge but states it is "regular."  She reports some mild constipation and some dizziness earlier today, but no other symptoms.    Past Medical History:  Diagnosis Date   Allergy    Asthma    UTI (urinary tract infection)     Past Surgical History:  Procedure Laterality Date   TOE SURGERY      Family History  Problem Relation Age of Onset   Diabetes Mother    Diabetes Paternal Grandmother    Diabetes Paternal Grandfather     Social History   Tobacco Use   Smoking status: Never   Smokeless tobacco: Never  Vaping Use   Vaping Use: Every day  Substance Use Topics   Alcohol use: No   Drug use: No    Allergies:  Allergies  Allergen Reactions   Other Itching, Rash and Swelling   Soy Allergy Itching, Swelling and Rash    Medications Prior to Admission  Medication Sig Dispense Refill Last Dose   acetaminophen (TYLENOL) 500 MG tablet Take 1,000 mg by mouth every 6 (six) hours as needed.      albuterol (VENTOLIN HFA) 108 (90 Base) MCG/ACT inhaler TAKE 2 PUFFS BY MOUTH EVERY 6 HOURS  AS NEEDED FOR WHEEZE OR SHORTNESS OF BREATH 6.7 each 2    Ascorbic Acid (VITAMIN C) 100 MG CHEW Chew 1 each by mouth daily in the afternoon.      azithromycin (ZITHROMAX Z-PAK) 250 MG tablet Take 2 tablets on the first day, then 1 tablet for the next 4 days. 6 tablet 0    Cholecalciferol (VITAMIN D3) 125 MCG (5000 UT) CAPS Take 1 capsule by mouth daily in the afternoon.      Cyanocobalamin (VITAMIN B12 PO) Take 1 each by mouth daily in the afternoon.      etonogestrel (NEXPLANON) 68 MG IMPL implant 1 each by Subdermal route once. Inserted 2019      Fluticasone Propionate, Inhal, 100 MCG/BLIST AEPB Inhale 1 puff into the lungs 2 (two) times daily. 28 each 1    Norethindrone Acetate-Ethinyl Estrad-FE (LOESTRIN 24 FE) 1-20 MG-MCG(24) tablet Take 1 tablet by mouth daily. 84 tablet 3    ondansetron (ZOFRAN ODT) 4 MG disintegrating tablet Take 1 tablet (4 mg total) by mouth every 8 (eight) hours as needed for nausea or vomiting. 20 tablet 0    ondansetron (ZOFRAN) 4 MG tablet Take 1 tablet (4 mg total) by mouth daily as needed for nausea or vomiting. 20 tablet 0    triamcinolone  cream (KENALOG) 0.1 % Apply 1 application topically 2 (two) times daily. 30 g 0     Review of Systems  Constitutional:  Negative for chills and fever.  Gastrointestinal:  Positive for abdominal pain and constipation. Negative for diarrhea, nausea and vomiting.  Genitourinary:  Positive for vaginal bleeding. Negative for difficulty urinating, dysuria and vaginal discharge.  Neurological:  Positive for dizziness. Negative for light-headedness and headaches.  Physical Exam   Blood pressure 124/65, pulse 81, temperature 98 F (36.7 C), temperature source Oral, resp. rate 15, height 5\' 4"  (1.626 m), weight 88.2 kg, last menstrual period 04/18/2021, SpO2 100 %.  Physical Exam Vitals reviewed.  Constitutional:      Appearance: Normal appearance.  HENT:     Head: Normocephalic and atraumatic.  Eyes:     Conjunctiva/sclera:  Conjunctivae normal.  Cardiovascular:     Rate and Rhythm: Normal rate.  Pulmonary:     Effort: Pulmonary effort is normal. No respiratory distress.  Musculoskeletal:        General: Normal range of motion.     Cervical back: Normal range of motion.  Neurological:     Mental Status: She is alert and oriented to person, place, and time.  Psychiatric:        Mood and Affect: Mood normal.        Behavior: Behavior normal.    MAU Course  Procedures Results for orders placed or performed during the hospital encounter of 05/19/21 (from the past 24 hour(s))  I-Stat Beta hCG blood, ED ([facility], [facility], [facility] only)     Status: Abnormal   Collection Time: 05/19/21  9:25 PM  Result Value Ref Range   I-stat hCG, quantitative 10.8 (H) <5 mIU/mL   Comment 3          ABO/Rh     Status: None (Preliminary result)   Collection Time: 05/19/21 11:10 PM  Result Value Ref Range   ABO/RH(D) PENDING     MDM Labs: ABO, hCG Cultures: Wet Prep, GC/CT Pain Medication Assessment and Plan  20 year old G2P0 at 4.3 weeks Vaginal Bleeding  -Informed that hCG is 10.8 by Istat. Discussed need for further evaluation, which does not include 36 currently. -Will collect labs including repeat hCG and ABO. -Discussed need for repeat hCG in 48 hours. -Cautioned that several labs may be necessary to determine viability in proper location.  -Patient verbalizes understanding and agreeable with POC. -Scheduled for repeat hCG at 0815 on Tuesday Nov 22 at [facility]. -Will give tylenol for abdominal cramping. -Instructed to collect self-swab for STD/I testing. -Will release all results to mychart. -No questions or concerns. -Bleeding Precautions Given. -Encouraged to call primary office or return to MAU if symptoms worsen or with the onset of new symptoms. -Discharged to home in stable condition.   [facility] 05/19/2021, 10:49 PM

## 2021-05-19 NOTE — ED Notes (Signed)
Report given to MAU RN on patient's transfer.  

## 2021-05-19 NOTE — ED Triage Notes (Signed)
Patient is [redacted] weeks pregnant G1P0 reports low abdominal cramping and vaginal bleeding today .

## 2021-05-20 LAB — HCG, QUANTITATIVE, PREGNANCY: hCG, Beta Chain, Quant, S: 12 m[IU]/mL — ABNORMAL HIGH (ref <5)

## 2021-05-21 ENCOUNTER — Other Ambulatory Visit: Payer: Self-pay

## 2021-05-21 ENCOUNTER — Telehealth: Payer: Self-pay

## 2021-05-21 ENCOUNTER — Inpatient Hospital Stay (HOSPITAL_COMMUNITY)
Admission: AD | Admit: 2021-05-21 | Discharge: 2021-05-21 | Disposition: A | Discharge status: Home / Self Care | Payer: BC Managed Care – PPO | Attending: Obstetrics and Gynecology | Admitting: Obstetrics and Gynecology

## 2021-05-21 DIAGNOSIS — O209 Hemorrhage in early pregnancy, unspecified: Secondary | ICD-10-CM | POA: Diagnosis not present

## 2021-05-21 DIAGNOSIS — Z3A01 Less than 8 weeks gestation of pregnancy: Secondary | ICD-10-CM | POA: Diagnosis not present

## 2021-05-21 DIAGNOSIS — O26891 Other specified pregnancy related conditions, first trimester: Secondary | ICD-10-CM | POA: Diagnosis not present

## 2021-05-21 DIAGNOSIS — R102 Pelvic and perineal pain: Secondary | ICD-10-CM | POA: Insufficient documentation

## 2021-05-21 DIAGNOSIS — O4692 Antepartum hemorrhage, unspecified, second trimester: Secondary | ICD-10-CM

## 2021-05-21 DIAGNOSIS — O99891 Other specified diseases and conditions complicating pregnancy: Secondary | ICD-10-CM | POA: Diagnosis not present

## 2021-05-21 LAB — GC/CHLAMYDIA PROBE AMP (~~LOC~~) NOT AT ARMC
Chlamydia: NEGATIVE
Comment: NEGATIVE
Comment: NORMAL
Neisseria Gonorrhea: NEGATIVE

## 2021-05-21 LAB — CBC
HCT: 34.7 % — ABNORMAL LOW (ref 36.0–46.0)
Hemoglobin: 11.5 g/dL — ABNORMAL LOW (ref 12.0–15.0)
MCH: 28.5 pg (ref 26.0–34.0)
MCHC: 33.1 g/dL (ref 30.0–36.0)
MCV: 85.9 fL (ref 80.0–100.0)
Platelets: 227 10*3/uL (ref 150–400)
RBC: 4.04 MIL/uL (ref 3.87–5.11)
RDW: 12 % (ref 11.5–15.5)
WBC: 4 10*3/uL (ref 4.0–10.5)
nRBC: 0 % (ref 0.0–0.2)

## 2021-05-21 LAB — HCG, QUANTITATIVE, PREGNANCY: hCG, Beta Chain, Quant, S: 7 m[IU]/mL — ABNORMAL HIGH (ref <5)

## 2021-05-21 MED ORDER — IBUPROFEN 600 MG PO TABS: 600.0000 mg | ORAL_TABLET | Freq: Four times a day (QID) | ORAL | 3 refills | Status: DC | PRN

## 2021-05-21 NOTE — Telephone Encounter (Signed)
Patient called stating that she is four weeks pregnant. Patient is having bleeding and terrible pain. Patient was scheduled to come for stat HCG tomorrow. Patient instructed to have someone take her to Maternity Admissions unit at [facility] now. Patient states understanding and agrees with plan. Armandina Stammer RN

## 2021-05-21 NOTE — MAU Provider Note (Signed)
History     CSN: [id number]  Arrival date and time: 05/21/21 1201   Event Date/Time   First Provider Initiated Contact with Patient 05/21/21 1229      Chief Complaint  Patient presents with   Abdominal Pain   Vaginal Bleeding   HPI This is a 20 yo G2P0010 at [redacted]w[redacted]d by LMP. She presents with increasing pelvic starting this morning with some spotting. Pain is nonradiating and was quite severe this morning. She took some excedrin and her pain has improved some. She was seen here 2 days ago and had a serum quant of 12.   OB History     Gravida  2   Para  0   Term  0   Preterm  0   AB  1   Living  0      SAB  1   IAB  0   Ectopic  0   Multiple  0   Live Births  0           Past Medical History:  Diagnosis Date   Allergy    Asthma    UTI (urinary tract infection)     Past Surgical History:  Procedure Laterality Date   TOE SURGERY      Family History  Problem Relation Age of Onset   Diabetes Mother    Diabetes Paternal Grandmother    Diabetes Paternal Grandfather     Social History   Tobacco Use   Smoking status: Never   Smokeless tobacco: Never  Vaping Use   Vaping Use: Every day  Substance Use Topics   Alcohol use: No   Drug use: No    Allergies:  Allergies  Allergen Reactions   Other Itching, Rash and Swelling   Soy Allergy Itching, Swelling and Rash    Medications Prior to Admission  Medication Sig Dispense Refill Last Dose   acetaminophen (TYLENOL) 500 MG tablet Take 1,000 mg by mouth every 6 (six) hours as needed.      albuterol (VENTOLIN HFA) 108 (90 Base) MCG/ACT inhaler TAKE 2 PUFFS BY MOUTH EVERY 6 HOURS AS NEEDED FOR WHEEZE OR SHORTNESS OF BREATH 6.7 each 2    Cyanocobalamin (VITAMIN B12 PO) Take 1 each by mouth daily in the afternoon.      Fluticasone Propionate, Inhal, 100 MCG/BLIST AEPB Inhale 1 puff into the lungs 2 (two) times daily. 28 each 1    ondansetron (ZOFRAN ODT) 4 MG disintegrating tablet Take 1 tablet (4  mg total) by mouth every 8 (eight) hours as needed for nausea or vomiting. 20 tablet 0     Review of Systems Physical Exam   Blood pressure 118/75, pulse 72, temperature 98.6 F (37 C), temperature source Oral, resp. rate 19, height 5\' 4"  (1.626 m), weight 87 kg, last menstrual period 04/18/2021, SpO2 98 %.  Physical Exam Vitals reviewed.  Constitutional:      Appearance: She is well-developed.  HENT:     Head: Normocephalic and atraumatic.  Cardiovascular:     Rate and Rhythm: Normal rate and regular rhythm.  Pulmonary:     Effort: Pulmonary effort is normal.     Breath sounds: Normal breath sounds.  Abdominal:     Tenderness: There is abdominal tenderness in the right lower quadrant and left lower quadrant. There is no right CVA tenderness, left CVA tenderness, guarding or rebound.  Skin:    General: Skin is warm.     Capillary Refill: Capillary refill takes less than  2 seconds.  Neurological:     General: No focal deficit present.     Mental Status: She is alert and oriented to person, place, and time.   Results for orders placed or performed during the hospital encounter of 05/21/21 (from the past 24 hour(s))  CBC     Status: Abnormal   Collection Time: 05/21/21 12:39 PM  Result Value Ref Range   WBC 4.0 4.0 - 10.5 K/uL   RBC 4.04 3.87 - 5.11 MIL/uL   Hemoglobin 11.5 (L) 12.0 - 15.0 g/dL   HCT 94.5 (L) 03.8 - 88.2 %   MCV 85.9 80.0 - 100.0 fL   MCH 28.5 26.0 - 34.0 pg   MCHC 33.1 30.0 - 36.0 g/dL   RDW 80.0 34.9 - 17.9 %   Platelets 227 150 - 400 K/uL   nRBC 0.0 0.0 - 0.2 %  hCG, quantitative, pregnancy     Status: Abnormal   Collection Time: 05/21/21 12:39 PM  Result Value Ref Range   hCG, Beta Chain, Quant, S 7 (H) <5 mIU/mL     MAU Course  Procedures  MDM Patient's quant is 7 today.  At this point, it is uncertain whether the patient had a miscarriage.  There are some degree of a slightly elevated hCG at baseline of 11-12.  This could certainly be the case  and the patient having a leak.  That is heavier and more painful than normal.  Assessment and Plan   1. Pelvic cramping    Patient discharged home.  We will give ibuprofen 600 mg every 6 hours, which was sent to the pharmacy.  Follow-up as needed  Emily Foster 05/21/2021, 12:29 PM

## 2021-05-21 NOTE — MAU Note (Signed)
Presents with c/o cramping and VB, states worse than it was 2 days ago.  Reports VB is spotting with wiping.  States took Excedrin for cramping this morning @ 1105, reports pain unrelieved.

## 2021-05-22 ENCOUNTER — Other Ambulatory Visit: Payer: BC Managed Care – PPO

## 2021-06-22 ENCOUNTER — Telehealth: Payer: BC Managed Care – PPO | Admitting: Nurse Practitioner

## 2021-06-22 DIAGNOSIS — J029 Acute pharyngitis, unspecified: Secondary | ICD-10-CM | POA: Diagnosis not present

## 2021-06-23 NOTE — Progress Notes (Signed)
E-Visit for Sore Throat  We are sorry that you are not feeling well.  Here is how we plan to help!  Your symptoms indicate a likely viral infection (Pharyngitis).   Pharyngitis is inflammation in the back of the throat which can cause a sore throat, scratchiness and sometimes difficulty swallowing.   Pharyngitis is typically caused by a respiratory virus and will just run its course.  Please keep in mind that your symptoms could last up to 10 days.  For throat pain, we recommend over the counter oral pain relief medications such as acetaminophen or aspirin, or anti-inflammatory medications such as ibuprofen or naproxen sodium.  Topical treatments such as oral throat lozenges or sprays may be used as needed.  Avoid close contact with loved ones, especially the very young and elderly.  Remember to wash your hands thoroughly throughout the day as this is the number one way to prevent the spread of infection and wipe down door knobs and counters with disinfectant.  After careful review of your answers, I would not recommend an antibiotic for your condition.  Antibiotics should not be used to treat conditions that we suspect are caused by viruses like the virus that causes the common cold or flu. However, some people can have Strep with atypical symptoms. You may need formal testing in clinic or office to confirm if your symptoms continue or worsen.  For your pain it looks like you have a prescription for ibuprofen 60mg  for you to take. You can crush it in applesauce, ice cream or jello if that helps to swallow.  Please follow back up with through Evisit or with your PCP if your throat pain does not improve in 5 days.   Providers prescribe antibiotics to treat infections caused by bacteria. Antibiotics are very powerful in treating bacterial infections when they are used properly.  To maintain their effectiveness, they should be used only when necessary.  Overuse of antibiotics has resulted in the  development of super bugs that are resistant to treatment!    Home Care: Only take medications as instructed by your medical team. Do not drink alcohol while taking these medications. A steam or ultrasonic humidifier can help congestion.  You can place a towel over your head and breathe in the steam from hot water coming from a faucet. Avoid close contacts especially the very young and the elderly. Cover your mouth when you cough or sneeze. Always remember to wash your hands.  Get Help Right Away If: You develop worsening fever or throat pain. You develop a severe head ache or visual changes. Your symptoms persist after you have completed your treatment plan.  Make sure you Understand these instructions. Will watch your condition. Will get help right away if you are not doing well or get worse.   Thank you for choosing an e-visit.  Your e-visit answers were reviewed by a board certified advanced clinical practitioner to complete your personal care plan. Depending upon the condition, your plan could have included both over the counter or prescription medications.  Please review your pharmacy choice. Make sure the pharmacy is open so you can pick up prescription now. If there is a problem, you may contact your provider through [place] and have the prescription routed to another pharmacy.  Your safety is important to [employer]. If you have drug allergies check your prescription carefully.   For the next 24 hours you can use MyChart to ask questions about today's visit, request a non-urgent call  back, or ask for a work or school excuse. You will get an email in the next two days asking about your experience. I hope that your e-visit has been valuable and will speed your recovery.

## 2021-06-23 NOTE — Progress Notes (Signed)
I have spent 5 minutes in review of e-visit questionnaire, review and updating patient chart, medical decision making and response to patient.  ° °Emali Heyward W Christropher Gintz, NP ° °  °

## 2021-06-29 ENCOUNTER — Telehealth: Payer: BC Managed Care – PPO | Admitting: Physician Assistant

## 2021-06-29 DIAGNOSIS — T3695XA Adverse effect of unspecified systemic antibiotic, initial encounter: Secondary | ICD-10-CM

## 2021-06-29 MED ORDER — FLUCONAZOLE 150 MG PO TABS: 150.0000 mg | ORAL_TABLET | Freq: Once | ORAL | 0 refills | Status: AC

## 2021-06-29 NOTE — Progress Notes (Signed)

## 2021-06-29 NOTE — Progress Notes (Signed)
Of note patient labeled as pregnant in chart but in questionnaire marked confident not pregnant. On EMR review it seemed she had two visits to MAU in the past couple of months with vaginal bleeding during pregnancy with miscarriage but the pregnancy status cannot be updated via this visit type.

## 2021-06-29 NOTE — Progress Notes (Signed)
I have spent 5 minutes in review of e-visit questionnaire, review and updating patient chart, medical decision making and response to patient.   Krissy Orebaugh Cody Cederic Mozley, PA-C    

## 2021-07-05 ENCOUNTER — Telehealth: Payer: BC Managed Care – PPO | Admitting: Physician Assistant

## 2021-07-05 DIAGNOSIS — T24202A Burn of second degree of unspecified site of left lower limb, except ankle and foot, initial encounter: Secondary | ICD-10-CM

## 2021-07-05 NOTE — Progress Notes (Signed)
Based on what you shared with me, I feel your condition warrants further evaluation and I recommend that you be seen in a face to face visit.  Giving size of blister overlying the burn itself, it is recommended you be evaluated in person today as the blister may need to be drained to ease discomfort and so the provider can get a better idea of how significant the underlying burn truly is.    NOTE: There will be NO CHARGE for this eVisit   If you are having a true medical emergency please call 911.      For an urgent face to face visit, [place] has six urgent care centers for your convenience:     [facility] Ur[facility] Center at [facility] Directions [phone] [address] [place], [place] [address]    [facility] Health Urgent Care Center [facility]) Get Driving Directions [phone] [address] [place], [place] [address]  [facility] Health Urgent Care Center [facility]) Get Driving Directions [phone] [address] [place],  [place]  [address]  [facility] Health Urgent Care at [facility] Get Driving Directions [phone] [address], [address] [place], [place] [address]   [facility] Health Urgent Care at [facility] Get Driving Directions  [phone] [address] [place], [place] [address]   [facility] Health Urgent Care at [facility] Directions [phone] [address], [address] [place], [place] [address]  Your MyChart E-visit questionnaire answers were reviewed by a board certified advanced clinical practitioner to complete your personal care plan based on your specific symptoms.  Thank you for using e-Visits.

## 2021-07-06 ENCOUNTER — Emergency Department (HOSPITAL_COMMUNITY)
Admission: EM | Admit: 2021-07-06 | Discharge: 2021-07-07 | Disposition: A | Discharge status: Home / Self Care | Payer: BC Managed Care – PPO | Attending: Emergency Medicine | Admitting: Emergency Medicine

## 2021-07-06 ENCOUNTER — Emergency Department (HOSPITAL_COMMUNITY)
Admission: EM | Admit: 2021-07-06 | Discharge: 2021-07-06 | Discharge status: Against Medical Advice | Payer: BC Managed Care – PPO

## 2021-07-06 ENCOUNTER — Encounter (HOSPITAL_COMMUNITY): Payer: Self-pay

## 2021-07-06 ENCOUNTER — Other Ambulatory Visit: Payer: Self-pay

## 2021-07-06 DIAGNOSIS — Y9389 Activity, other specified: Secondary | ICD-10-CM | POA: Diagnosis not present

## 2021-07-06 DIAGNOSIS — T25012A Burn of unspecified degree of left ankle, initial encounter: Secondary | ICD-10-CM | POA: Diagnosis not present

## 2021-07-06 DIAGNOSIS — T25212A Burn of second degree of left ankle, initial encounter: Secondary | ICD-10-CM | POA: Diagnosis not present

## 2021-07-06 DIAGNOSIS — Y990 Civilian activity done for income or pay: Secondary | ICD-10-CM | POA: Insufficient documentation

## 2021-07-06 DIAGNOSIS — T24232A Burn of second degree of left lower leg, initial encounter: Secondary | ICD-10-CM | POA: Diagnosis not present

## 2021-07-06 DIAGNOSIS — X118XXA Contact with other hot tap-water, initial encounter: Secondary | ICD-10-CM | POA: Insufficient documentation

## 2021-07-06 DIAGNOSIS — Y9289 Other specified places as the place of occurrence of the external cause: Secondary | ICD-10-CM | POA: Insufficient documentation

## 2021-07-06 DIAGNOSIS — T300 Burn of unspecified body region, unspecified degree: Secondary | ICD-10-CM

## 2021-07-06 DIAGNOSIS — T310 Burns involving less than 10% of body surface: Secondary | ICD-10-CM | POA: Diagnosis not present

## 2021-07-06 MED ORDER — HYDROCODONE-ACETAMINOPHEN 5-325 MG PO TABS
1.0000 | ORAL_TABLET | Freq: Once | ORAL | Status: AC
  Administered 2021-07-06: 1 via ORAL
  Filled 2021-07-06: qty 1
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
## 2021-07-07 DIAGNOSIS — T25212A Burn of second degree of left ankle, initial encounter: Secondary | ICD-10-CM | POA: Diagnosis not present

## 2021-07-07 MED ORDER — SILVER SULFADIAZINE 1 % EX CREA
TOPICAL_CREAM | Freq: Once | CUTANEOUS | Status: AC
  Filled 2021-07-07: qty 85
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
## 2021-07-07 NOTE — Discharge Instructions (Signed)
Continue wound care at home twice a day. Follow-up with your primary care doctor. Return here for new concerns.

## 2021-07-09 ENCOUNTER — Encounter (HOSPITAL_COMMUNITY): Payer: Self-pay

## 2021-07-09 ENCOUNTER — Other Ambulatory Visit: Payer: Self-pay

## 2021-07-09 ENCOUNTER — Ambulatory Visit (HOSPITAL_COMMUNITY)
Admission: EM | Admit: 2021-07-09 | Discharge: 2021-07-09 | Disposition: A | Discharge status: Home / Self Care | Payer: BC Managed Care – PPO | Attending: Physician Assistant | Admitting: Physician Assistant

## 2021-07-09 DIAGNOSIS — T25212A Burn of second degree of left ankle, initial encounter: Secondary | ICD-10-CM

## 2021-07-09 DIAGNOSIS — L089 Local infection of the skin and subcutaneous tissue, unspecified: Secondary | ICD-10-CM | POA: Diagnosis not present

## 2021-07-09 MED ORDER — CEPHALEXIN 500 MG PO CAPS: 500.0000 mg | ORAL_CAPSULE | Freq: Three times a day (TID) | ORAL | 0 refills | Status: DC

## 2021-07-09 MED ORDER — MUPIROCIN 2 % EX OINT: 1.0000 "application " | TOPICAL_OINTMENT | Freq: Every day | CUTANEOUS | 2 refills | Status: DC

## 2021-07-09 NOTE — Discharge Instructions (Signed)
I am concerned you are developing an infection.  Start cephalexin 3 times a day for 1 week.  Continue with daily dressing changes and use Bactroban ointment.  If your symptoms or not improving please follow-up with wound specialist; call to schedule an appointment.  If you have any worsening symptoms including fever, nausea, vomiting, body aches, spread of redness, increased pain you need to go to the emergency room.  Alternate Tylenol and ibuprofen for pain.  If symptoms not improving please return for reevaluation.
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
## 2021-07-13 IMAGING — DX DG CHEST 2V
2 series · 2 of 2 positions shown · non-contrast
Comparison: None.

CLINICAL DATA: Shortness of breath

EXAM:
CHEST - 2 VIEW

[chest pa]
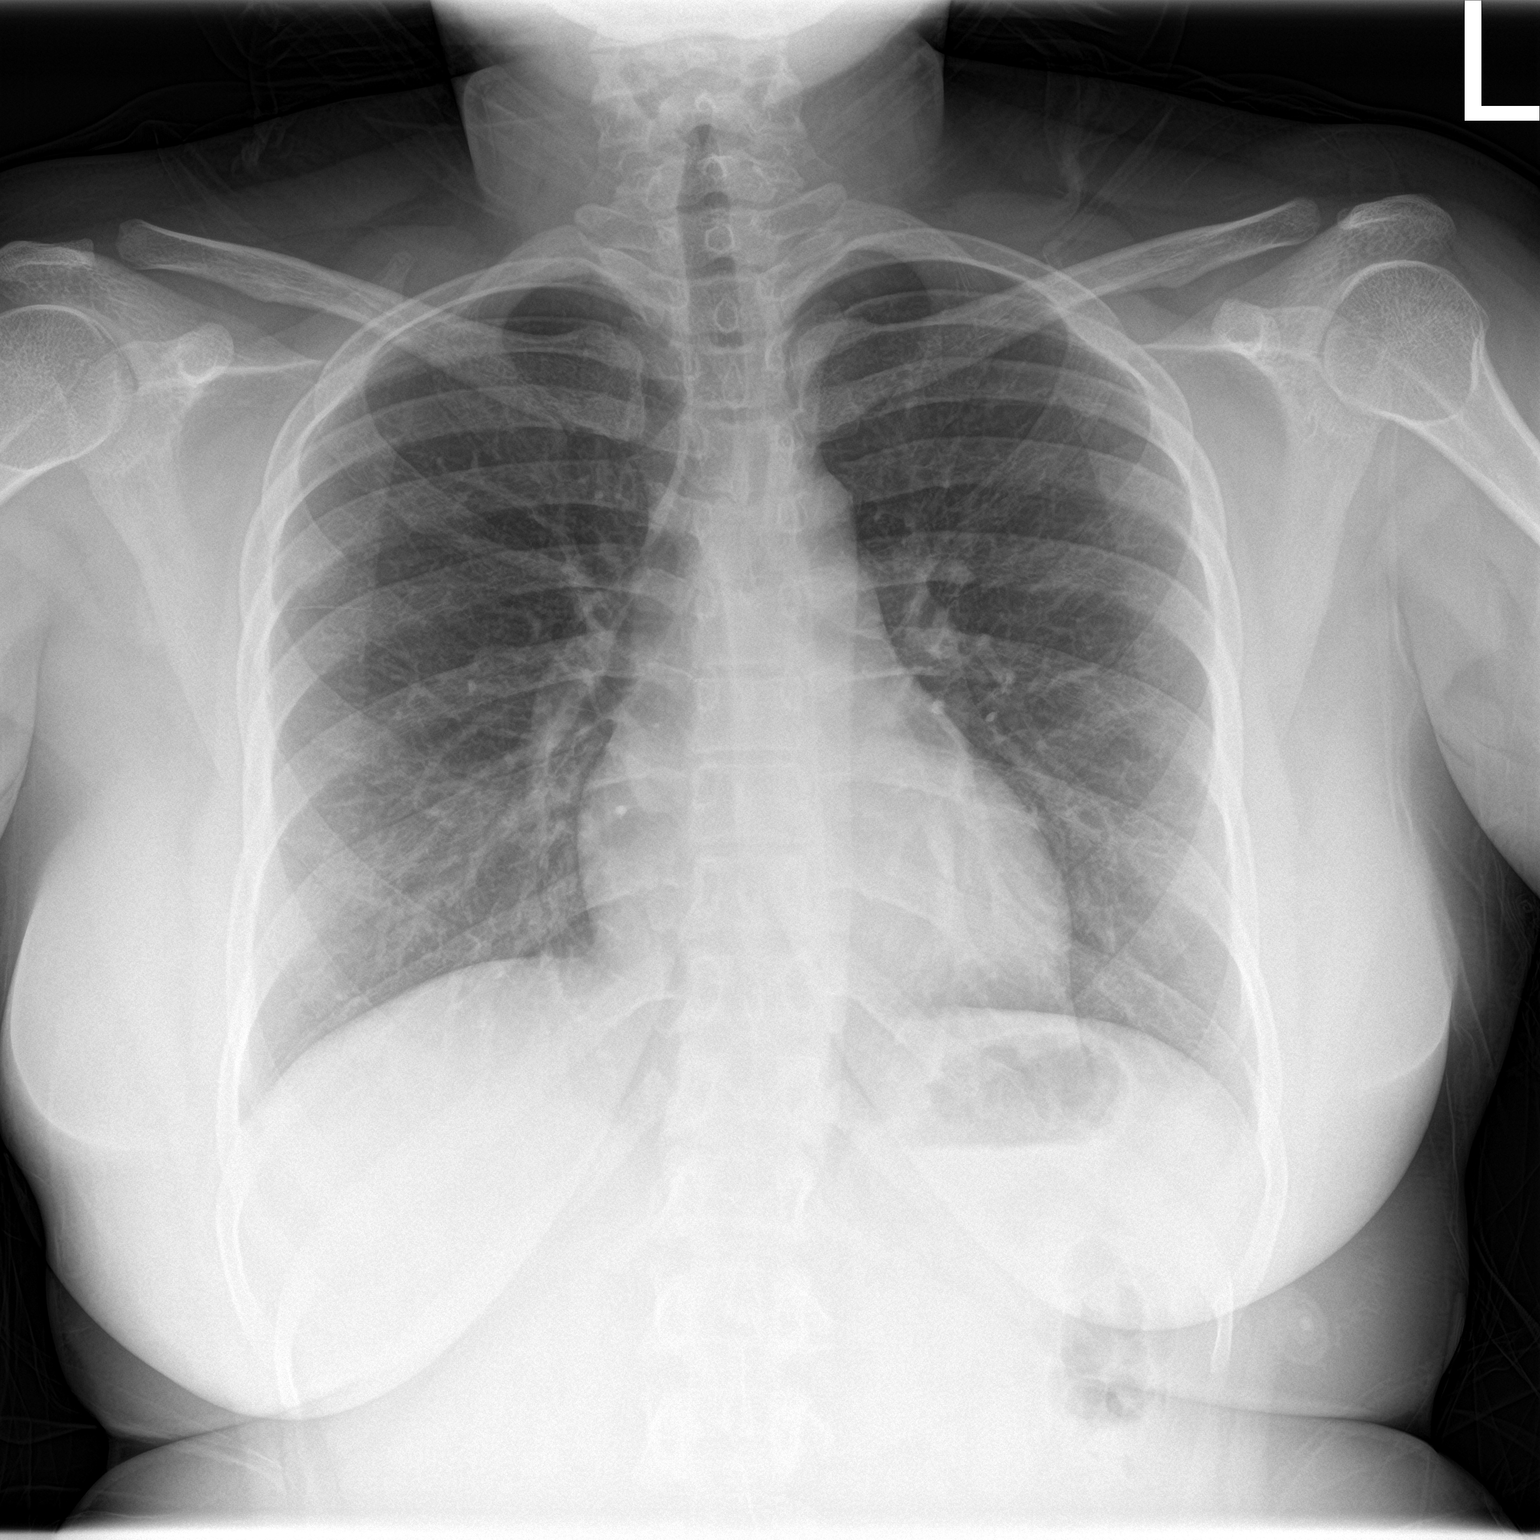

[chest lat]
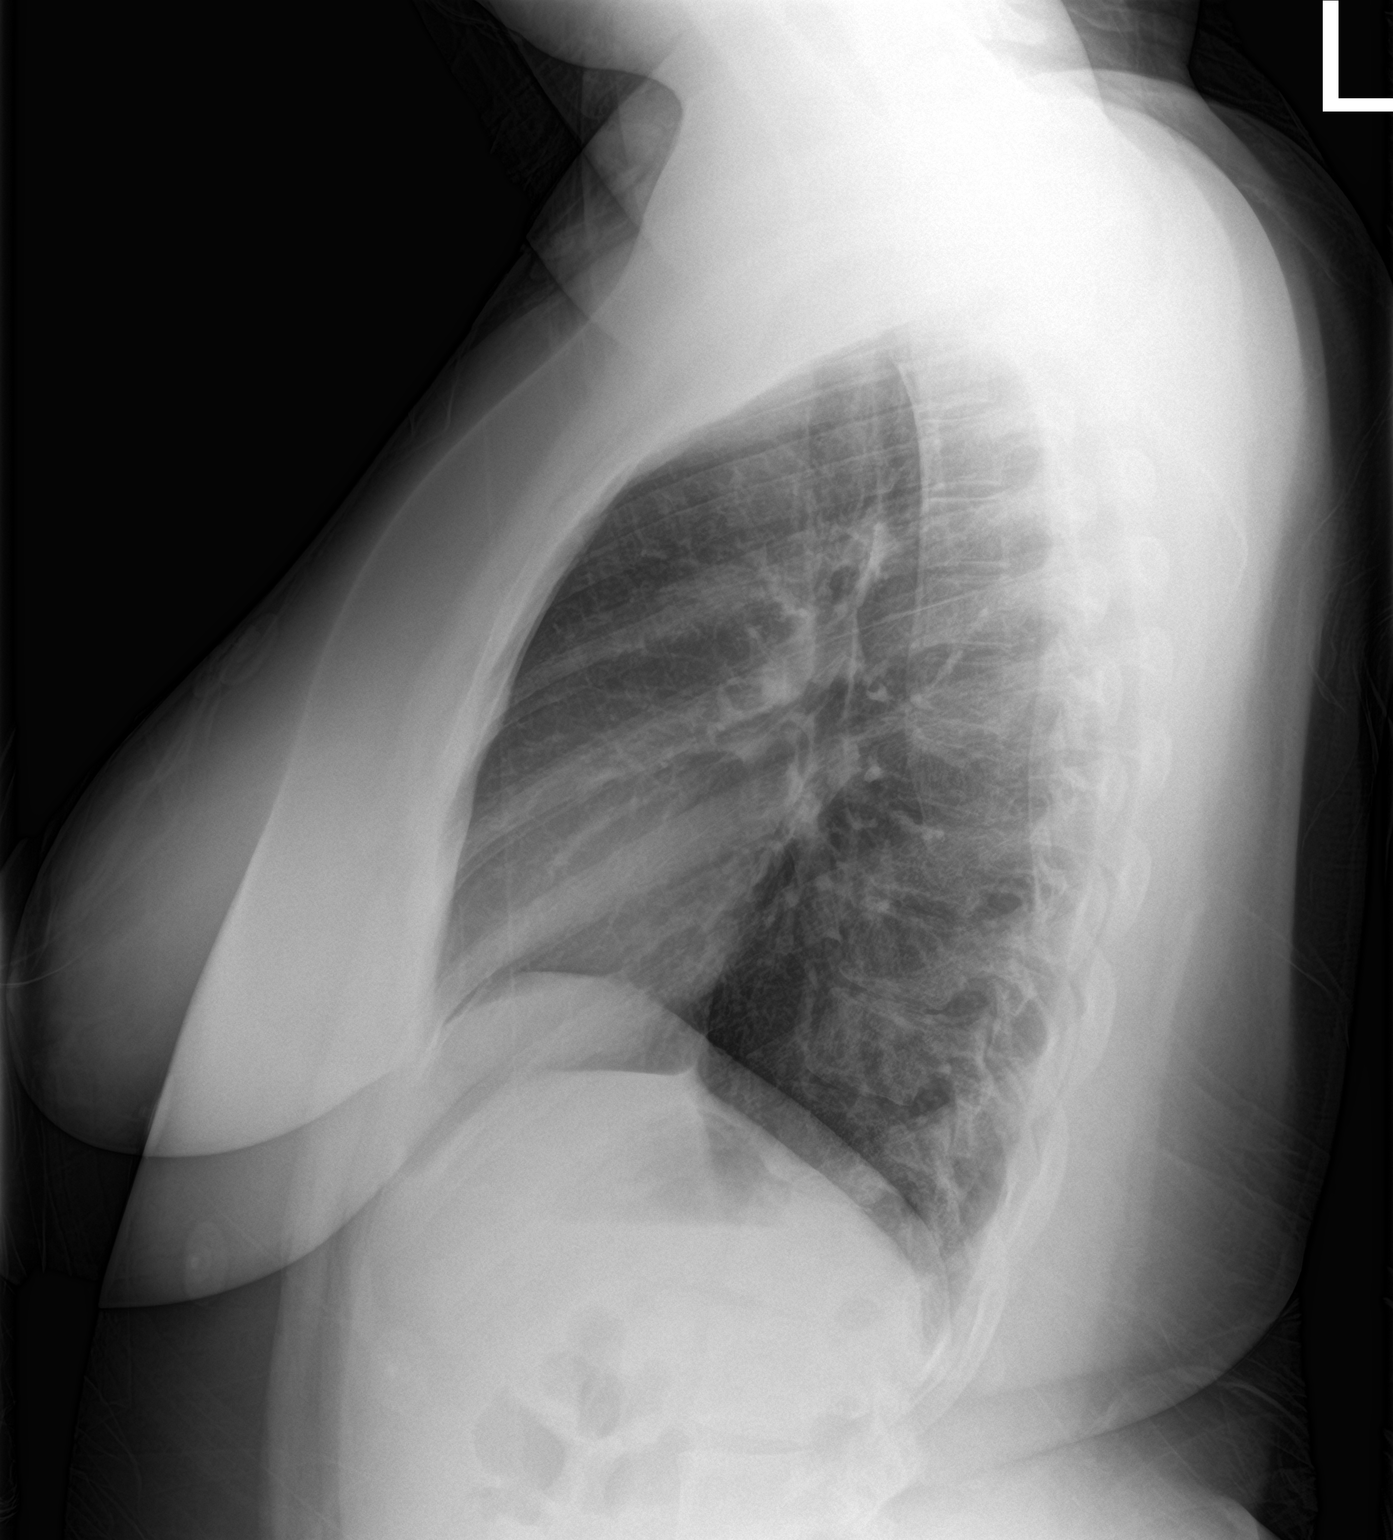

[2 of 2 positions shown; findings below may reference images not displayed]

FINDINGS: Lungs are clear. Heart size and pulmonary vascularity are normal. No
adenopathy. No bone lesions.
IMPRESSION: Lungs clear.  Cardiac silhouette normal.

## 2021-07-25 ENCOUNTER — Telehealth: Payer: BC Managed Care – PPO | Admitting: Physician Assistant

## 2021-07-25 DIAGNOSIS — N941 Unspecified dyspareunia: Secondary | ICD-10-CM

## 2021-07-25 NOTE — Progress Notes (Signed)
Based on what you shared with me, I feel your condition warrants further evaluation and I recommend that you be seen in a face to face visit.   In the absence of actual discharge and itch, and the pain with intercourse/recurring symptoms, you need to be evaluated in person to make sure you get the proper diagnosis and treatment.   NOTE: There will be NO CHARGE for this eVisit   If you are having a true medical emergency please call 911.      For an urgent face to face visit, [facility] has six urgent care centers for your convenience:     [facility] at [facility] Directions [phone] [address] [place], [place] [address]    [facility]) Get Driving Directions [phone] [address] [place], [place] [address]  [facility] Center [facility] [place] [facility]) Get Driving Directions [phone] [address] [place],  [place]  [address]  [facility] at [facility] Get Driving Directions [phone] [address] [place], [place] [address]   [facility] at [facility] Get Driving Directions  [phone] [address] [place], [place] [address]   [facility] Urgent Care at [facility] Directions [phone] [address], [address] [place], [place] [address]  Your MyChart E-visit questionnaire answers were reviewed by a board certified advanced clinical practitioner to complete your personal care plan based on your specific symptoms.  Thank you for using e-Visits.

## 2021-07-26 DIAGNOSIS — Z131 Encounter for screening for diabetes mellitus: Secondary | ICD-10-CM | POA: Diagnosis not present

## 2021-07-31 ENCOUNTER — Encounter (HOSPITAL_BASED_OUTPATIENT_CLINIC_OR_DEPARTMENT_OTHER): Payer: BC Managed Care – PPO | Attending: Internal Medicine | Admitting: Internal Medicine

## 2021-08-30 ENCOUNTER — Encounter (HOSPITAL_BASED_OUTPATIENT_CLINIC_OR_DEPARTMENT_OTHER): Payer: BC Managed Care – PPO | Attending: Internal Medicine | Admitting: General Surgery

## 2021-10-25 ENCOUNTER — Telehealth: Payer: BC Managed Care – PPO | Admitting: Family Medicine

## 2021-10-25 DIAGNOSIS — R300 Dysuria: Secondary | ICD-10-CM | POA: Diagnosis not present

## 2021-10-25 MED ORDER — NITROFURANTOIN MONOHYD MACRO 100 MG PO CAPS: 100.0000 mg | ORAL_CAPSULE | Freq: Two times a day (BID) | ORAL | 0 refills | Status: AC

## 2021-10-25 MED ORDER — FLUCONAZOLE 150 MG PO TABS
150.0000 mg | ORAL_TABLET | Freq: Once | ORAL | 0 refills | Status: AC
Start: 2021-10-25 — End: 2021-10-25

## 2021-10-25 NOTE — Addendum Note (Signed)
Addended by: Freddy Finner on: 10/25/2021 02:48 PM ? ? Modules accepted: Orders, Level of Service ? ?

## 2021-10-25 NOTE — Progress Notes (Addendum)
Addend: ? ?Chart reports 27 w and 1 d; EV reports not pregnant in EV in chart. Patient reports she miscarried in Nov 2022. See communication in EV  ? ?Will provide treatment for conditions EV reports ? ?E-Visit for Urinary Problems ? ?We are sorry that you are not feeling well.  Here is how we plan to help! ? ?Based on what you shared with me it looks like you most likely have a simple urinary tract infection. ? ?A UTI (Urinary Tract Infection) is a bacterial infection of the bladder. ? ?Most cases of urinary tract infections are simple to treat but a key part of your care is to encourage you to drink plenty of fluids and watch your symptoms carefully. ? ?I have prescribed MacroBid 100 mg twice a day for 5 days.  Your symptoms should gradually improve. Call us if the burning in your urine worsens, you develop worsening fever, back pain or pelvic pain or if your symptoms do not resolve after completing the antibiotic. ? ?And Diflucan for yeast infection as well. ? ?Urinary tract infections can be prevented by drinking plenty of water to keep your body hydrated.  Also be sure when you wipe, wipe from front to back and don't hold it in!  If possible, empty your bladder every 4 hours. ? ?HOME CARE ?Drink plenty of fluids ?Compete the full course of the antibiotics even if the symptoms resolve ?Remember, when you need to go?go. Holding in your urine can increase the likelihood of getting a UTI! ?GET HELP RIGHT AWAY IF: ?You cannot urinate ?You get a high fever ?Worsening back pain occurs ?You see blood in your urine ?You feel sick to your stomach or throw up ?You feel like you are going to pass out ? ?MAKE SURE YOU  ?Understand these instructions. ?Will watch your condition. ?Will get help right away if you are not doing well or get worse. ? ? ?Thank you for choosing an e-visit. ? ?Your e-visit answers were reviewed by a board certified advanced clinical practitioner to complete your personal care plan. Depending upon  the condition, your plan could have included both over the counter or prescription medications. ? ?Please review your pharmacy choice. Make sure the pharmacy is open so you can pick up prescription now. If there is a problem, you may contact your provider through [employer] and have the prescription routed to another pharmacy.  Your safety is important to [place]. If you have drug allergies check your prescription carefully.  ? ?For the next 24 hours you can use MyChart to ask questions about today's visit, request a non-urgent call back, or ask for a work or school excuse. ?You will get an email in the next two days asking about your experience. I hope that your e-visit has been valuable and will speed your recovery. ? ?I provided 5 minutes of non face-to-face time during this encounter for chart review, medication and order placement, as well as and documentation.  ?  ?

## 2021-11-12 ENCOUNTER — Ambulatory Visit: Payer: Self-pay | Admitting: *Deleted

## 2021-11-12 ENCOUNTER — Encounter (HOSPITAL_COMMUNITY): Payer: Self-pay | Admitting: Obstetrics and Gynecology

## 2021-11-12 ENCOUNTER — Inpatient Hospital Stay (HOSPITAL_COMMUNITY): Payer: BC Managed Care – PPO

## 2021-11-12 ENCOUNTER — Inpatient Hospital Stay (HOSPITAL_COMMUNITY)
Admission: AD | Admit: 2021-11-12 | Discharge: 2021-11-13 | Disposition: A | Discharge status: Home / Self Care | Payer: BC Managed Care – PPO | Attending: Obstetrics and Gynecology | Admitting: Obstetrics and Gynecology

## 2021-11-12 DIAGNOSIS — O4693 Antepartum hemorrhage, unspecified, third trimester: Secondary | ICD-10-CM | POA: Diagnosis not present

## 2021-11-12 DIAGNOSIS — O26893 Other specified pregnancy related conditions, third trimester: Secondary | ICD-10-CM | POA: Diagnosis not present

## 2021-11-12 DIAGNOSIS — Z3A29 29 weeks gestation of pregnancy: Secondary | ICD-10-CM | POA: Diagnosis not present

## 2021-11-12 DIAGNOSIS — O209 Hemorrhage in early pregnancy, unspecified: Secondary | ICD-10-CM | POA: Diagnosis not present

## 2021-11-12 DIAGNOSIS — Z3A01 Less than 8 weeks gestation of pregnancy: Secondary | ICD-10-CM | POA: Diagnosis not present

## 2021-11-12 DIAGNOSIS — O3680X0 Pregnancy with inconclusive fetal viability, not applicable or unspecified: Secondary | ICD-10-CM | POA: Diagnosis not present

## 2021-11-12 DIAGNOSIS — N939 Abnormal uterine and vaginal bleeding, unspecified: Secondary | ICD-10-CM | POA: Diagnosis not present

## 2021-11-12 LAB — URINALYSIS, ROUTINE W REFLEX MICROSCOPIC
Bilirubin Urine: NEGATIVE
Glucose, UA: NEGATIVE mg/dL
Ketones, ur: NEGATIVE mg/dL
Leukocytes,Ua: NEGATIVE
Nitrite: NEGATIVE
Protein, ur: NEGATIVE mg/dL
Specific Gravity, Urine: 1.012 (ref 1.005–1.030)
pH: 6 (ref 5.0–8.0)

## 2021-11-12 LAB — ABO/RH: ABO/RH(D): AB POS

## 2021-11-12 LAB — CBC
HCT: 33.3 % — ABNORMAL LOW (ref 36.0–46.0)
Hemoglobin: 10.8 g/dL — ABNORMAL LOW (ref 12.0–15.0)
MCH: 28.3 pg (ref 26.0–34.0)
MCHC: 32.4 g/dL (ref 30.0–36.0)
MCV: 87.4 fL (ref 80.0–100.0)
Platelets: 235 10*3/uL (ref 150–400)
RBC: 3.81 MIL/uL — ABNORMAL LOW (ref 3.87–5.11)
RDW: 12.3 % (ref 11.5–15.5)
WBC: 6.9 10*3/uL (ref 4.0–10.5)
nRBC: 0 % (ref 0.0–0.2)

## 2021-11-12 LAB — POCT PREGNANCY, URINE: Preg Test, Ur: POSITIVE — AB

## 2021-11-12 NOTE — Telephone Encounter (Signed)
Patient disconnected call before agent could transfer- community line call ?

## 2021-11-12 NOTE — MAU Note (Signed)
Pt says last preg was a SAB on 05-21-2021 ?LMP- 10-04-2021  ?HPT on 11-09-2021-positive ?VB started at 2pm- when she  wiped  ?In Triage - no pad, or underwear - has on black pants- no bleeding per pt. ?Denies pain or cramping ?

## 2021-11-13 DIAGNOSIS — O3680X0 Pregnancy with inconclusive fetal viability, not applicable or unspecified: Secondary | ICD-10-CM | POA: Diagnosis not present

## 2021-11-13 DIAGNOSIS — O26893 Other specified pregnancy related conditions, third trimester: Secondary | ICD-10-CM | POA: Diagnosis not present

## 2021-11-13 LAB — HCG, QUANTITATIVE, PREGNANCY: hCG, Beta Chain, Quant, S: 2641 m[IU]/mL — ABNORMAL HIGH (ref <5)

## 2021-11-13 MED ORDER — PREPLUS 27-1 MG PO TABS: 1.0000 | ORAL_TABLET | Freq: Every day | ORAL | 13 refills | Status: DC

## 2021-11-13 NOTE — MAU Provider Note (Signed)
Chief Complaint:  Vaginal Bleeding ? ? None  ?  ?HPI: Emily Foster is a 21 y.o. G2P0020 at [redacted]w[redacted]d who presents to maternity admissions reporting very light pink vaginal spotting when wiping that began at 2pm. Has stopped since she got to MAU (2100). Denies pain or cramping. LMP 10/04/21 with +UPT on 11/09/21. No other physical complaints. ? ?Pregnancy Course: Has not started OB care anywhere, was last seen for GYN care at [facility]. ? ?Past Medical History:  ?Diagnosis Date  ? Allergy   ? Asthma   ? UTI (urinary tract infection)   ? ?OB History  ?Gravida Para Term Preterm AB Living  ?2 0 0 0 2 0  ?SAB IAB Ectopic Multiple Live Births  ?2 0 0 0 0  ?  ?# Outcome Date GA Lbr Len/2nd Weight Sex Delivery Anes PTL Lv  ?2 SAB 2021          ?1 SAB           ? ?Past Surgical History:  ?Procedure Laterality Date  ? TOE SURGERY    ? ?Family History  ?Problem Relation Age of Onset  ? Diabetes Mother   ? Diabetes Paternal Grandmother   ? Diabetes Paternal Grandfather   ? ?Social History  ? ?Tobacco Use  ? Smoking status: Never  ? Smokeless tobacco: Never  ?Vaping Use  ? Vaping Use: Every day  ?Substance Use Topics  ? Alcohol use: No  ? Drug use: No  ? ?Allergies  ?Allergen Reactions  ? Other Itching, Rash and Swelling  ? Soy Allergy Itching, Swelling and Rash  ? ?Medications Prior to Admission  ?Medication Sig Dispense Refill Last Dose  ? acetaminophen (TYLENOL) 500 MG tablet Take 1,000 mg by mouth every 6 (six) hours as needed.     ? albuterol (VENTOLIN HFA) 108 (90 Base) MCG/ACT inhaler TAKE 2 PUFFS BY MOUTH EVERY 6 HOURS AS NEEDED FOR WHEEZE OR SHORTNESS OF BREATH 6.7 each 2   ? cephALEXin (KEFLEX) 500 MG capsule Take 1 capsule (500 mg total) by mouth 3 (three) times daily. 21 capsule 0   ? Cyanocobalamin (VITAMIN B12 PO) Take 1 each by mouth daily in the afternoon.     ? Fluticasone Propionate, Inhal, 100 MCG/BLIST AEPB Inhale 1 puff into the lungs 2 (two) times daily. 28 each 1   ? ibuprofen (ADVIL) 600 MG tablet Take 1 tablet  (600 mg total) by mouth every 6 (six) hours as needed. 30 tablet 3   ? mupirocin ointment (BACTROBAN) 2 % Apply 1 application topically daily. 22 g 2   ? ondansetron (ZOFRAN ODT) 4 MG disintegrating tablet Take 1 tablet (4 mg total) by mouth every 8 (eight) hours as needed for nausea or vomiting. 20 tablet 0   ? ?I have reviewed patient's Past Medical Hx, Surgical Hx, Family Hx, Social Hx, medications and allergies.  ? ?ROS:  ?Pertinent items noted in HPI and remainder of comprehensive ROS otherwise negative.  ? ?Physical Exam  ?Patient Vitals for the past 24 hrs: ? BP Temp Temp src Pulse Resp Height Weight  ?11/13/21 0038 -- -- -- -- 20 -- --  ?11/12/21 2124 109/64 98.7 ?F (37.1 ?C) Oral 84 20 5\' 5"  (1.651 m) 167 lb 1.6 oz (75.8 kg)  ? ?Constitutional: Well-developed, well-nourished female in no acute distress.  ?Cardiovascular: normal rate & rhythm ?Respiratory: normal effort ?GI: Abd soft, non-tender ?MS: Extremities nontender, no edema, normal ROM ?Neurologic: Alert and oriented x 4.  ?GU: no CVA tenderness ?  Pelvic: deferred, sent to U/S from MAU lobby due to MAU acuity ?  ?Labs: ?Results for orders placed or performed during the hospital encounter of 11/12/21 (from the past 24 hour(s))  ?Urinalysis, Routine w reflex microscopic     Status: Abnormal  ? Collection Time: 11/12/21  9:38 PM  ?Result Value Ref Range  ? Color, Urine YELLOW YELLOW  ? APPearance CLEAR CLEAR  ? Specific Gravity, Urine 1.012 1.005 - 1.030  ? pH 6.0 5.0 - 8.0  ? Glucose, UA NEGATIVE NEGATIVE mg/dL  ? Hgb urine dipstick MODERATE (A) NEGATIVE  ? Bilirubin Urine NEGATIVE NEGATIVE  ? Ketones, ur NEGATIVE NEGATIVE mg/dL  ? Protein, ur NEGATIVE NEGATIVE mg/dL  ? Nitrite NEGATIVE NEGATIVE  ? Leukocytes,Ua NEGATIVE NEGATIVE  ? RBC / HPF 0-5 0 - 5 RBC/hpf  ? WBC, UA 0-5 0 - 5 WBC/hpf  ? Bacteria, UA RARE (A) NONE SEEN  ? Squamous Epithelial / LPF 0-5 0 - 5  ? Mucus PRESENT   ?Pregnancy, urine POC     Status: Abnormal  ? Collection Time: 11/12/21   9:40 PM  ?Result Value Ref Range  ? Preg Test, Ur POSITIVE (A) NEGATIVE  ?CBC     Status: Abnormal  ? Collection Time: 11/12/21 10:46 PM  ?Result Value Ref Range  ? WBC 6.9 4.0 - 10.5 K/uL  ? RBC 3.81 (L) 3.87 - 5.11 MIL/uL  ? Hemoglobin 10.8 (L) 12.0 - 15.0 g/dL  ? HCT 33.3 (L) 36.0 - 46.0 %  ? MCV 87.4 80.0 - 100.0 fL  ? MCH 28.3 26.0 - 34.0 pg  ? MCHC 32.4 30.0 - 36.0 g/dL  ? RDW 12.3 11.5 - 15.5 %  ? Platelets 235 150 - 400 K/uL  ? nRBC 0.0 0.0 - 0.2 %  ?ABO/Rh     Status: None  ? Collection Time: 11/12/21 10:46 PM  ?Result Value Ref Range  ? ABO/RH(D) AB POS   ? No rh immune globuloin    ?  NOT A RH IMMUNE GLOBULIN CANDIDATE, PT RH POSITIVE ?Performed at [facility], [address], [place], [place] [address] ?  ?hCG, quantitative, pregnancy     Status: Abnormal  ? Collection Time: 11/12/21 10:46 PM  ?Result Value Ref Range  ? hCG, Beta Chain, Quant, S 2,641 (H) <5 mIU/mL  ? ?Imaging:  ?US OB LESS THAN 14 WEEKS WITH OB TRANSVAGINAL ? ?Result Date: 11/12/2021 ?CLINICAL DATA:  Vaginal bleeding. EXAM: OBSTETRIC <14 WK [place] AND TRANSVAGINAL OB US TECHNIQUE: Both transabdominal and transvaginal ultrasound examinations were performed for complete evaluation of the gestation as well as the maternal uterus, adnexal regions, and pelvic cul-de-sac. Transvaginal technique was performed to assess early pregnancy. COMPARISON:  None Available. FINDINGS: Intrauterine gestational sac: Single Yolk sac:  Not Visualized. Embryo:  Not Visualized. MSD: 5.5 mm   5 w   2 d Subchorionic hemorrhage: None visualized. There is a small amount of fluid in the endometrial canal. Maternal uterus/adnexae: Bilateral ovaries are within normal limits. No free fluid. IMPRESSION: 1. Probable early intrauterine gestational sac, but no yolk sac, fetal pole, or cardiac activity yet visualized. Recommend follow-up quantitative B-HCG levels and follow-up US in 14 days to assess viability. This recommendation follows SRU consensus guidelines:  Diagnostic Criteria for Nonviable Pregnancy Early in the First Trimester. Malva Limes Med 2013; 295:1884-16. 2. There is a small amount of fluid in the endometrial canal. Electronically Signed   By: Darliss Cheney M.D.   On: 11/12/2021 23:15   ? ?  MAU Course: ?Orders Placed This Encounter  ?Procedures  ? US OB LESS THAN 14 WEEKS WITH OB TRANSVAGINAL  ? Urinalysis, Routine w reflex microscopic  ? CBC  ? hCG, quantitative, pregnancy  ? Pregnancy, urine POC  ? ABO/Rh  ? Discharge patient  ? ?Meds ordered this encounter  ?Medications  ? Prenatal Vit-Fe Fumarate-FA (PREPLUS) 27-1 MG TABS  ?  Sig: Take 1 tablet by mouth daily.  ?  Dispense:  30 tablet  ?  Refill:  13  ?  Order Specific Question:   Supervising Provider  ?  Answer:   Reva BoresRATT, TANYA S [[id number]]  ? ?MDM: ?Findings consistent with pregnancy of unknown location. Results given to patient along with ectopic precautions and need for repeat quant HCG in two days. Pt lives in [place], so [facility]. Stat [facility]cg [facility] Thursday. ? ?Assessment: ?1. Pregnancy of unknown anatomic location   ? ?Plan: ?Discharge home in stable condition with strong ectopic precautions  ?  ? Follow-up Information   ? ? [facility] Follow up in 2 day(s).   ?Specialty: Obstetrics and Gynecology ?Why: at 1:30pm for repeat bloodwork ?Contact information: ?[address] ?[place] [phone] ?[phone] ? ?  ?  ? ?  ?  ? ?  ?  ?Allergies as of 11/13/2021   ? ?   Reactions  ? Other Itching, Rash, Swelling  ? Soy Allergy Itching, Swelling, Rash  ? ?  ? ?  ?Medication List  ?  ? ?STOP taking these medications   ? ?cephALEXin 500 MG capsule ?Commonly known as: KEFLEX ?  ?ibuprofen 600 MG tablet ?Commonly known as: ADVIL ?  ?mupirocin ointment 2 % ?Commonly known as: BACTROBAN ?  ?ondansetron 4 MG disintegrating tablet ?Commonly known as: Zofran ODT ?  ? ?  ? ?TAKE these medications    ? ?acetaminophen 500 MG tablet ?Commonly known as: TYLENOL ?Take 1,000 mg by mouth every 6 (six) hours as needed. ?  ?albuterol 108 (90 Base) MCG/ACT inhaler ?Commonly known as: VENTOLIN HFA ?TAKE 2 PUFFS BY MOUTH E

## 2021-11-15 ENCOUNTER — Other Ambulatory Visit (INDEPENDENT_AMBULATORY_CARE_PROVIDER_SITE_OTHER): Payer: BC Managed Care – PPO | Admitting: General Practice

## 2021-11-15 VITALS — BP 116/63 | HR 72 | Ht 65.0 in | Wt 168.0 lb

## 2021-11-15 DIAGNOSIS — O3680X0 Pregnancy with inconclusive fetal viability, not applicable or unspecified: Secondary | ICD-10-CM | POA: Diagnosis not present

## 2021-11-15 LAB — BETA HCG QUANT (REF LAB): hCG Quant: 4215 m[IU]/mL

## 2021-11-15 NOTE — Progress Notes (Signed)
Beta HCG Follow-up Visit  Emily Foster presents to [facility] for follow-up beta HCG lab. She was seen in MAU for  spotting  on May 16. Patient reports spotting stopped after MAU visit but started back this morning.  She denies pain/cramping today. Discussed with patient that we are following beta HCG levels today. Results will be back in approximately 2 hours. Valid contact number for patient confirmed. I will call the patient with results.   Beta HCG results:        5/16        2,641          Emily Foster 11/15/2021 2:21 PM

## 2021-11-17 ENCOUNTER — Telehealth: Payer: BC Managed Care – PPO | Admitting: Nurse Practitioner

## 2021-11-17 DIAGNOSIS — Z3491 Encounter for supervision of normal pregnancy, unspecified, first trimester: Secondary | ICD-10-CM

## 2021-11-17 DIAGNOSIS — N941 Unspecified dyspareunia: Secondary | ICD-10-CM

## 2021-11-17 NOTE — Progress Notes (Signed)
I have spent 5 minutes in review of e-visit questionnaire, review and updating patient chart, medical decision making and response to patient.  ° °Emily Erbe W Ariyon Mittleman, NP ° °  °

## 2021-11-17 NOTE — Progress Notes (Signed)
For the safety of you and your child, I recommend a face to face office visit with a health care provider.  Because you are early first trimester and also endorsing painful sexual intercourse I would recommend you need to be evaluated in person to make sure you get the proper diagnosis and treatment.  Many mothers need to take medicines during their pregnancy and while nursing.  Almost all medicines pass into the breast milk in small quantities.  Most are generally considered safe for a mother to take but some medicines must be avoided.  After reviewing your E-Visit request, I recommend that you consult your OB/GYN or pediatrician for medical advice in relation to your condition and prescription medications while pregnant or breastfeeding.  NOTE:  There will be NO CHARGE for this eVisit  If you are having a true medical emergency please call 911.    For an urgent face to face visit, [place] has six urgent care centers for your convenience:     [facility] Health Urgent Care Center at [facility] Directions [phone] [address] [place], [place] [address]    [facility] Health Urgent Care Center [facility]) Get Driving Directions [phone] [address] [place], [place] [address]  [facility] Health Urgent Care Center [facility]) Get Driving Directions [phone] [address] [place],  [place]  [address]  [facility] Health Urgent Care at [facility] Get Driving Directions [phone] [address] [place], [place] [address]   [facility] Health Urgent Care at [facility] Get Driving Directions  [phone] [address] [place], [place] [address]   [facility] Health Urgent Care at [facility] Directions [phone] [address] [place], [place] [address]  Your MyChart E-visit questionnaire answers were reviewed by a board certified advanced clinical practitioner to complete your  personal care plan based on your specific symptoms.  Thank you for using e-Visits.

## 2021-11-19 ENCOUNTER — Telehealth: Payer: Self-pay

## 2021-11-19 DIAGNOSIS — O3680X0 Pregnancy with inconclusive fetal viability, not applicable or unspecified: Secondary | ICD-10-CM

## 2021-11-19 NOTE — Telephone Encounter (Signed)
Call placed to pt. Spoke with pt. Pt given update and recommendations per Dr Jolayne Panther for follow up US for viability this week. Pt verbalized understanding and agreeable to plan of care.  Orders placed.  Laney Pastor

## 2021-11-19 NOTE — Telephone Encounter (Signed)
-----   Message from Catalina Antigua, MD sent at 11/19/2021  8:30 AM EDT ----- Good morning  Please schedule viability ultrasound for this patient this week if possible  Thanks  Peggy

## 2021-11-20 ENCOUNTER — Ambulatory Visit (INDEPENDENT_AMBULATORY_CARE_PROVIDER_SITE_OTHER): Payer: BC Managed Care – PPO

## 2021-11-20 DIAGNOSIS — O3680X0 Pregnancy with inconclusive fetal viability, not applicable or unspecified: Secondary | ICD-10-CM | POA: Diagnosis not present

## 2021-11-22 ENCOUNTER — Telehealth: Payer: BC Managed Care – PPO | Admitting: Family Medicine

## 2021-11-22 DIAGNOSIS — R112 Nausea with vomiting, unspecified: Secondary | ICD-10-CM

## 2021-11-22 NOTE — Progress Notes (Signed)
[place]   N/V in pregnancy- advised OB office visit to make sure nothing else is going on prior to medication start.   Message sent on this EV

## 2021-11-25 ENCOUNTER — Inpatient Hospital Stay (HOSPITAL_COMMUNITY)
Admission: AD | Admit: 2021-11-25 | Discharge: 2021-11-25 | Disposition: A | Discharge status: Home / Self Care | Payer: BC Managed Care – PPO | Attending: Family Medicine | Admitting: Family Medicine

## 2021-11-25 ENCOUNTER — Encounter (HOSPITAL_COMMUNITY): Payer: Self-pay

## 2021-11-25 ENCOUNTER — Other Ambulatory Visit: Payer: Self-pay

## 2021-11-25 DIAGNOSIS — O210 Mild hyperemesis gravidarum: Secondary | ICD-10-CM | POA: Insufficient documentation

## 2021-11-25 DIAGNOSIS — F12188 Cannabis abuse with other cannabis-induced disorder: Secondary | ICD-10-CM | POA: Diagnosis not present

## 2021-11-25 DIAGNOSIS — O208 Other hemorrhage in early pregnancy: Secondary | ICD-10-CM | POA: Insufficient documentation

## 2021-11-25 DIAGNOSIS — O209 Hemorrhage in early pregnancy, unspecified: Secondary | ICD-10-CM | POA: Insufficient documentation

## 2021-11-25 DIAGNOSIS — Z3A01 Less than 8 weeks gestation of pregnancy: Secondary | ICD-10-CM | POA: Insufficient documentation

## 2021-11-25 LAB — URINALYSIS, ROUTINE W REFLEX MICROSCOPIC
Bilirubin Urine: NEGATIVE
Glucose, UA: NEGATIVE mg/dL
Hgb urine dipstick: NEGATIVE
Ketones, ur: NEGATIVE mg/dL
Leukocytes,Ua: NEGATIVE
Nitrite: NEGATIVE
Protein, ur: 30 mg/dL — AB
Specific Gravity, Urine: 1.031 — ABNORMAL HIGH (ref 1.005–1.030)
pH: 6 (ref 5.0–8.0)

## 2021-11-25 LAB — RAPID URINE DRUG SCREEN, HOSP PERFORMED
Amphetamines: NOT DETECTED
Barbiturates: NOT DETECTED
Benzodiazepines: NOT DETECTED
Cocaine: NOT DETECTED
Opiates: NOT DETECTED
Tetrahydrocannabinol: POSITIVE — AB

## 2021-11-25 LAB — BASIC METABOLIC PANEL
Anion gap: 8 (ref 5–15)
BUN: <5 mg/dL — ABNORMAL LOW (ref 6–20)
CO2: 23 mmol/L (ref 22–32)
Calcium: 9.7 mg/dL (ref 8.9–10.3)
Chloride: 106 mmol/L (ref 98–111)
Creatinine, Ser: 0.74 mg/dL (ref 0.44–1.00)
GFR, Estimated: >60 mL/min (ref >60)
Glucose, Bld: 105 mg/dL — ABNORMAL HIGH (ref 70–99)
Potassium: 3.7 mmol/L (ref 3.5–5.1)
Sodium: 137 mmol/L (ref 135–145)

## 2021-11-25 LAB — CBC
HCT: 33.7 % — ABNORMAL LOW (ref 36.0–46.0)
Hemoglobin: 11.3 g/dL — ABNORMAL LOW (ref 12.0–15.0)
MCH: 28.9 pg (ref 26.0–34.0)
MCHC: 33.5 g/dL (ref 30.0–36.0)
MCV: 86.2 fL (ref 80.0–100.0)
Platelets: 245 10*3/uL (ref 150–400)
RBC: 3.91 MIL/uL (ref 3.87–5.11)
RDW: 12.2 % (ref 11.5–15.5)
WBC: 6.9 10*3/uL (ref 4.0–10.5)
nRBC: 0 % (ref 0.0–0.2)

## 2021-11-25 MED ORDER — PROMETHAZINE HCL 12.5 MG PO TABS: 12.5000 mg | ORAL_TABLET | Freq: Four times a day (QID) | ORAL | 0 refills | Status: DC | PRN

## 2021-11-25 MED ORDER — PYRIDOXINE HCL 25 MG PO TABS: 25.0000 mg | ORAL_TABLET | Freq: Three times a day (TID) | ORAL | 0 refills | Status: DC

## 2021-11-25 MED ORDER — DIPHENHYDRAMINE HCL 50 MG/ML IJ SOLN
25.0000 mg | Freq: Once | INTRAMUSCULAR | Status: DC
Start: 2021-11-25 — End: 2021-11-25

## 2021-11-25 MED ORDER — HALOPERIDOL LACTATE 5 MG/ML IJ SOLN
2.0000 mg | Freq: Once | INTRAMUSCULAR | Status: DC
  Filled 2021-11-25: qty 0.4

## 2021-11-25 MED ORDER — ONDANSETRON 4 MG PO TBDP
4.0000 mg | ORAL_TABLET | Freq: Once | ORAL | Status: AC
  Administered 2021-11-25: 4 mg via ORAL
  Filled 2021-11-25: qty 1

## 2021-11-25 MED ORDER — DOXYLAMINE SUCCINATE (SLEEP) 25 MG PO TABS: 25.0000 mg | ORAL_TABLET | Freq: Three times a day (TID) | ORAL | 0 refills | Status: DC | PRN

## 2021-11-25 MED ORDER — LACTATED RINGERS IV BOLUS: 1000.0000 mL | Freq: Once | INTRAVENOUS | Status: DC

## 2021-11-25 NOTE — Discharge Instructions (Signed)
Vomiting in First Trimester Follow these instructions at home: To help relieve your symptoms, listen to your body. Everyone is different and has different preferences. Find what works best for you. Here are some things you can try to help relieve your symptoms: Meals and snacks Eat 5-6 small meals daily instead of 3 large meals. Eating small meals and snacks can help you avoid an empty stomach. Before getting out of bed, eat a couple of crackers to avoid moving around on an empty stomach. Eat a protein-rich snack before bed. Examples include cheese and crackers, or a peanut butter sandwich made with 1 slice of whole-wheat bread and 1 tsp (5 g) of peanut butter. Eat and drink slowly. Try eating starchy foods as these are usually tolerated well. Examples include cereal, toast, bread, potatoes, pasta, rice, and pretzels. Eat at least one serving of protein with your meals and snacks. Protein options include lean meats, poultry, seafood, beans, nuts, nut butters, eggs, cheese, and yogurt. Eat or suck on things that have ginger in them. It may help to relieve nausea. Add  tsp (0.44 g) ground ginger to hot tea, or choose ginger tea.   Fluids It is important to stay hydrated. Try to: Drink small amounts of fluids often. Drink fluids 30 minutes before or after a meal to help lessen the feeling of a full stomach. Drink 100% fruit juice or an electrolyte drink. An electrolyte drink contains sodium, potassium, and chloride. Drink fluids that are cold, clear, and carbonated or sour. These include lemonade, ginger ale, lemon-lime soda, ice water, and sparkling water. Things to avoid Avoid the following: Eating foods that trigger your symptoms. These may include spicy foods, coffee, high-fat foods, very sweet foods, and acidic foods. Drinking more than 1 cup of fluid at a time. Skipping meals. Nausea can be more intense on an empty stomach. If you cannot tolerate food, do not force it. Try sucking on ice  chips or other frozen items and make up for missed calories later. Lying down within 2 hours after eating. Being exposed to environmental triggers. These may include food smells, smoky rooms, closed spaces, rooms with strong smells, warm or humid places, overly loud and noisy rooms, and rooms with motion or flickering lights. Try eating meals in a well-ventilated area that is free of strong smells. Making quick and sudden changes in your movement. Taking iron pills and multivitamins that contain iron. If you take prescription iron pills, do not stop taking them unless your health care provider approves. Preparing food. The smell of food can spoil your appetite or trigger nausea. General instructions Brush your teeth or use a mouth rinse after meals. Take over-the-counter and prescription medicines only as told by your health care provider. Follow instructions from your health care provider about eating or drinking restrictions. Talk with your health care provider about starting a supplement of vitamin B6. Continue to take your prenatal vitamins as told by your health care provider. If you are having trouble taking your prenatal vitamins, talk with your health care provider about other options. Keep all follow-up visits. This is important. Follow-up visits include prenatal visits. Contact a health care provider if: You have pain in your abdomen. You have a severe headache. You have vision problems. You are losing weight. You feel weak or dizzy. You cannot eat or drink without vomiting, especially if this goes on for a full day. Get help right away if: You cannot drink fluids without vomiting. You vomit blood. You have constant   nausea and vomiting. You are very weak. You faint. You have a fever and your symptoms suddenly get worse. Summary Making some changes to your eating habits may help relieve nausea and vomiting. This condition may be managed with lifestyle changes and medicines as  prescribed by your health care provider. If medicines do not help relieve nausea and vomiting, you may need to receive fluids through an IV at the hospital. This information is not intended to replace advice given to you by your health care provider. Make sure you discuss any questions you have with your health care provider. Document Revised: 01/10/2020 Document Reviewed: 01/10/2020 Elsevier Patient Education  2021 Elsevier Inc.  

## 2021-11-25 NOTE — MAU Provider Note (Addendum)
History     CSN: [id number]  Arrival date and time: 11/25/21 1219   Event Date/Time   First Provider Initiated Contact with Patient 11/25/21 1342      Chief Complaint  Patient presents with   Vaginal Bleeding   Nausea   HPI Emily Foster is a 21 y.o. G3P0020 at [redacted]w[redacted]d who presents to MAU with chief complaint of vomiting. This is a recurrent problem about three days  ago. Patient has been managing her first trimester nausea with THC. She last smoked about three days ago. She is unable to tolerate anything PO.  Patient also c/o of vaginal spotting 2/2 previous diagnosis of subchorionic hematoma. Patient states she only sees spotting during episodes of vomiting. She is not wearing a pad. She denies heavy vaginal bleeding, dysuria, flank pain, fever.  Patient states she recognizes the term "subchorionic hematoma" "but no one told me anything about it.   Patient has initiated care with [facility].  OB History     Gravida  3   Para  0   Term  0   Preterm  0   AB  2   Living  0      SAB  2   IAB  0   Ectopic  0   Multiple  0   Live Births  0           Past Medical History:  Diagnosis Date   Allergy    Asthma    UTI (urinary tract infection)     Past Surgical History:  Procedure Laterality Date   TOE SURGERY      Family History  Problem Relation Age of Onset   Diabetes Mother    Diabetes Paternal Grandmother    Diabetes Paternal Grandfather     Social History   Tobacco Use   Smoking status: Never   Smokeless tobacco: Never  Vaping Use   Vaping Use: Every day  Substance Use Topics   Alcohol use: No   Drug use: No    Allergies:  Allergies  Allergen Reactions   Other Itching, Rash and Swelling   Soy Allergy Itching, Swelling and Rash    Medications Prior to Admission  Medication Sig Dispense Refill Last Dose   acetaminophen (TYLENOL) 500 MG tablet Take 1,000 mg by mouth every 6 (six) hours as needed.      albuterol (VENTOLIN HFA) 108 (90  Base) MCG/ACT inhaler TAKE 2 PUFFS BY MOUTH EVERY 6 HOURS AS NEEDED FOR WHEEZE OR SHORTNESS OF BREATH 6.7 each 2    Cyanocobalamin (VITAMIN B12 PO) Take 1 each by mouth daily in the afternoon.      Fluticasone Propionate, Inhal, 100 MCG/BLIST AEPB Inhale 1 puff into the lungs 2 (two) times daily. 28 each 1    Prenatal Vit-Fe Fumarate-FA (PREPLUS) 27-1 MG TABS Take 1 tablet by mouth daily. 30 tablet 13     Review of Systems  Gastrointestinal:  Positive for nausea and vomiting.  Genitourinary:  Positive for vaginal bleeding.  All other systems reviewed and are negative. Physical Exam   Blood pressure 116/75, pulse 69, temperature 98.5 F (36.9 C), temperature source Oral, resp. rate 16, height 5\' 5"  (1.651 m), weight 75.2 kg, last menstrual period 10/04/2021, SpO2 100 %.  Physical Exam Vitals and nursing note reviewed. Exam conducted with a chaperone present.  Constitutional:      Appearance: Normal appearance.  Cardiovascular:     Rate and Rhythm: Normal rate and regular rhythm.  Pulses: Normal pulses.     Heart sounds: Normal heart sounds.  Pulmonary:     Effort: Pulmonary effort is normal.     Breath sounds: Normal breath sounds.  Abdominal:     General: Abdomen is flat.     Tenderness: There is no abdominal tenderness.  Skin:    Capillary Refill: Capillary refill takes less than 2 seconds.  Neurological:     Mental Status: She is alert and oriented to person, place, and time.  Psychiatric:        Mood and Affect: Mood normal.        Behavior: Behavior normal.        Thought Content: Thought content normal.        Judgment: Judgment normal.    MAU Course  Procedures  MDM  --Live IUP confirmed 05/  --Patient initially MSE'd in MAU Family Room due to lack of exam room availability. Discussed concern for Cannabis Hyperemesis, pathophysiology and likelihood of severe cyclical vomiting if THC use is restarted. Patient agreeable to waiting for room to receive IV fluids,  labs, plan to initiate of outpatient regimen for vomiting  --1500: Room available for patient. Patient communicated to [place], MAU Charge RN she would prefer to wait for UA results before moving to room. CNM in room immediately after, discussed concern that MAU will have high census as UA is in process, patient may have to wait for room again. Patient requests discharge home with antiemetic regimen.  Orders Placed This Encounter  Procedures   Urinalysis, Routine w reflex microscopic Urine, Clean Catch   CBC   Rapid urine drug screen (hospital performed)   Basic metabolic panel   Insert peripheral IV   Discharge patient   Patient Vitals for the past 24 hrs:  BP Temp Temp src Pulse Resp SpO2 Height Weight  11/25/21 1251 116/75 98.5 F (36.9 C) Oral 69 16 100 % -- --  11/25/21 1248 -- -- -- -- -- -- 5\' 5"  (1.651 m) 75.2 kg   Results for orders placed or performed during the hospital encounter of 11/25/21 (from the past 24 hour(s))  Urinalysis, Routine w reflex microscopic Urine, Clean Catch     Status: Abnormal   Collection Time: 11/25/21 12:52 PM  Result Value Ref Range   Color, Urine AMBER (A) YELLOW   APPearance CLOUDY (A) CLEAR   Specific Gravity, Urine 1.031 (H) 1.005 - 1.030   pH 6.0 5.0 - 8.0   Glucose, UA NEGATIVE NEGATIVE mg/dL   Hgb urine dipstick NEGATIVE NEGATIVE   Bilirubin Urine NEGATIVE NEGATIVE   Ketones, ur NEGATIVE NEGATIVE mg/dL   Protein, ur 30 (A) NEGATIVE mg/dL   Nitrite NEGATIVE NEGATIVE   Leukocytes,Ua NEGATIVE NEGATIVE   RBC / HPF 0-5 0 - 5 RBC/hpf   WBC, UA 0-5 0 - 5 WBC/hpf   Bacteria, UA RARE (A) NONE SEEN   Squamous Epithelial / LPF 11-20 0 - 5   Mucus PRESENT   Rapid urine drug screen (hospital performed)     Status: Abnormal   Collection Time: 11/25/21  1:09 PM  Result Value Ref Range   Opiates NONE DETECTED NONE DETECTED   Cocaine NONE DETECTED NONE DETECTED   Benzodiazepines NONE DETECTED NONE DETECTED   Amphetamines NONE DETECTED NONE  DETECTED   Tetrahydrocannabinol POSITIVE (A) NONE DETECTED   Barbiturates NONE DETECTED NONE DETECTED  CBC     Status: Abnormal   Collection Time: 11/25/21  1:11 PM  Result Value Ref Range  WBC 6.9 4.0 - 10.5 K/uL   RBC 3.91 3.87 - 5.11 MIL/uL   Hemoglobin 11.3 (L) 12.0 - 15.0 g/dL   HCT 16.133.7 (L) 09.636.0 - 04.546.0 %   MCV 86.2 80.0 - 100.0 fL   MCH 28.9 26.0 - 34.0 pg   MCHC 33.5 30.0 - 36.0 g/dL   RDW 40.912.2 81.111.5 - 91.415.5 %   Platelets 245 150 - 400 K/uL   nRBC 0.0 0.0 - 0.2 %  Basic metabolic panel     Status: Abnormal   Collection Time: 11/25/21  1:11 PM  Result Value Ref Range   Sodium 137 135 - 145 mmol/L   Potassium 3.7 3.5 - 5.1 mmol/L   Chloride 106 98 - 111 mmol/L   CO2 23 22 - 32 mmol/L   Glucose, Bld 105 (H) 70 - 99 mg/dL   BUN <5 (L) 6 - 20 mg/dL   Creatinine, Ser 7.820.74 0.44 - 1.00 mg/dL   Calcium 9.7 8.9 - 95.610.3 mg/dL   GFR, Estimated >21>60 >30>60 mL/min   Anion gap 8 5 - 15   Meds ordered this encounter  Medications   lactated ringers bolus 1,000 mL   diphenhydrAMINE (BENADRYL) injection 25 mg   haloperidol lactate (HALDOL) injection 2 mg    Cannabis Hyperemesis, patient reports smoking THC 3 days ago   ondansetron (ZOFRAN-ODT) disintegrating tablet 4 mg   pyridOXINE (VITAMIN B-6) 25 MG tablet    Sig: Take 1 tablet (25 mg total) by mouth every 8 (eight) hours.    Dispense:  30 tablet    Refill:  0    Order Specific Question:   Supervising Provider    Answer:   Reva BoresPRATT, TANYA S [[id number]]   doxylamine, Sleep, (UNISOM) 25 MG tablet    Sig: Take 1 tablet (25 mg total) by mouth every 8 (eight) hours as needed.    Dispense:  30 tablet    Refill:  0    Order Specific Question:   Supervising Provider    Answer:   Reva BoresPRATT, TANYA S [[id number]]   promethazine (PHENERGAN) 12.5 MG tablet    Sig: Take 1 tablet (12.5 mg total) by mouth every 6 (six) hours as needed for nausea or vomiting.    Dispense:  30 tablet    Refill:  0    Order Specific Question:   Supervising Provider    Answer:    Reva BoresPRATT, TANYA S [[id number]]   Assessment and Plan  --21 y.o. G3P0020 at 5273w3d  --Live IUP on BSUS --Cannabis Hyperemesis, +THC, abstinence advised --Tolerating PO s/p Zofran 4mg  ODT --No ketonuria or hypokalemia --New outpatient antiemetic regimen --Previously identified Subchorionic Hematoma, pelvic rest until cleared by OB --Blood type AB POS --Discharge home in stable condition  Calvert CantorSamantha C Jaqwan Wieber, MSA, MSN, CNM 11/25/2021, 5:15 PM

## 2021-11-25 NOTE — MAU Note (Signed)
Pt called, not in lobby 

## 2021-11-25 NOTE — MAU Note (Signed)
Emily Foster is a 21 y.o. at [redacted]w[redacted]d here in MAU reporting: ongoing bleeding. No pain. Started vomiting yesterday and is unsure about how many episodes. States there was blood in her vomit.   Onset of complaint: ongoing  Pain score: 0/10  Vitals:   11/25/21 1251  BP: 116/75  Pulse: 69  Resp: 16  Temp: 98.5 F (36.9 C)  SpO2: 100%     Lab orders placed from triage: UA

## 2021-12-11 ENCOUNTER — Telehealth: Payer: BC Managed Care – PPO | Admitting: Physician Assistant

## 2021-12-11 ENCOUNTER — Other Ambulatory Visit: Payer: Self-pay

## 2021-12-11 ENCOUNTER — Encounter (HOSPITAL_COMMUNITY): Payer: Self-pay | Admitting: Obstetrics & Gynecology

## 2021-12-11 ENCOUNTER — Inpatient Hospital Stay (HOSPITAL_COMMUNITY)
Admission: AD | Admit: 2021-12-11 | Discharge: 2021-12-12 | Disposition: A | Discharge status: Home / Self Care | Payer: BC Managed Care – PPO | Attending: Obstetrics & Gynecology | Admitting: Obstetrics & Gynecology

## 2021-12-11 DIAGNOSIS — R509 Fever, unspecified: Secondary | ICD-10-CM | POA: Insufficient documentation

## 2021-12-11 DIAGNOSIS — Z87440 Personal history of urinary (tract) infections: Secondary | ICD-10-CM | POA: Insufficient documentation

## 2021-12-11 DIAGNOSIS — N898 Other specified noninflammatory disorders of vagina: Secondary | ICD-10-CM

## 2021-12-11 DIAGNOSIS — O219 Vomiting of pregnancy, unspecified: Secondary | ICD-10-CM

## 2021-12-11 DIAGNOSIS — O210 Mild hyperemesis gravidarum: Secondary | ICD-10-CM | POA: Insufficient documentation

## 2021-12-11 DIAGNOSIS — O23591 Infection of other part of genital tract in pregnancy, first trimester: Secondary | ICD-10-CM | POA: Insufficient documentation

## 2021-12-11 DIAGNOSIS — N739 Female pelvic inflammatory disease, unspecified: Secondary | ICD-10-CM

## 2021-12-11 DIAGNOSIS — O99891 Other specified diseases and conditions complicating pregnancy: Secondary | ICD-10-CM | POA: Insufficient documentation

## 2021-12-11 DIAGNOSIS — Z3A09 9 weeks gestation of pregnancy: Secondary | ICD-10-CM | POA: Insufficient documentation

## 2021-12-11 DIAGNOSIS — N751 Abscess of Bartholin's gland: Secondary | ICD-10-CM

## 2021-12-11 LAB — URINALYSIS, ROUTINE W REFLEX MICROSCOPIC
Bilirubin Urine: NEGATIVE
Glucose, UA: NEGATIVE mg/dL
Hgb urine dipstick: NEGATIVE
Ketones, ur: 5 mg/dL — AB
Leukocytes,Ua: NEGATIVE
Nitrite: NEGATIVE
Protein, ur: NEGATIVE mg/dL
Specific Gravity, Urine: 1.015 (ref 1.005–1.030)
pH: 7 (ref 5.0–8.0)

## 2021-12-11 NOTE — MAU Note (Addendum)
.  Emily Foster is a 21 y.o. at [redacted]w[redacted]d here in MAU reporting "bump" on vagina since Sunday. Had fever Sunday thru today but lower today. 100.2 today. Reports watery diarrhea 10times today.   Onset of complaint: Sunday Pain score: 10 Vitals:   12/11/21 2232 12/11/21 2233  BP:  117/63  Pulse: 83   Resp: 17   Temp: 99.5 F (37.5 C)   SpO2: 100%      FHT:n/a Lab orders placed from triage: u/a

## 2021-12-11 NOTE — Progress Notes (Signed)
Because of concern of vaginal abscess during pregnancy, I feel your condition warrants further evaluation and I recommend that you be seen in a face to face visit. I recommend contacting your OBGYN office to speak with the doctor on call for further guidance or be seen at the MAU at [facility] hospital at [facility] for emergent evaluation.    NOTE: There will be NO CHARGE for this eVisit   If you are having a true medical emergency please call 911.      For an urgent face to face visit, [place] has six urgent care centers for your convenience:     [facility] Ur[facility] Care Center at [facility] Directions [phone] [address] [place], [place] [address]    [facility] Health Urgent Care Center [facility]) Get Driving Directions [phone] [address] [place], [place] [address]  [facility] Health Urgent Care Center [facility]) Get Driving Directions [phone] [address] [place],  [place]  [address]  [facility] Health Urgent Care at [facility] Get Driving Directions [phone] [address] [place], [place] [address]   [facility] Health Urgent Care at [facility] Get Driving Directions  [phone] [address] [place], [place] [address]   [facility] Health Urgent Care at [facility] Directions [phone] [address] [place], [place] [address]  Your MyChart E-visit questionnaire answers were reviewed by a board certified advanced clinical practitioner to complete your personal care plan based on your specific symptoms.  Thank you for using e-Visits.

## 2021-12-12 DIAGNOSIS — O99891 Other specified diseases and conditions complicating pregnancy: Secondary | ICD-10-CM | POA: Diagnosis not present

## 2021-12-12 DIAGNOSIS — N898 Other specified noninflammatory disorders of vagina: Secondary | ICD-10-CM

## 2021-12-12 DIAGNOSIS — Z87440 Personal history of urinary (tract) infections: Secondary | ICD-10-CM | POA: Diagnosis not present

## 2021-12-12 DIAGNOSIS — O210 Mild hyperemesis gravidarum: Secondary | ICD-10-CM | POA: Diagnosis not present

## 2021-12-12 DIAGNOSIS — R509 Fever, unspecified: Secondary | ICD-10-CM | POA: Diagnosis not present

## 2021-12-12 DIAGNOSIS — Z3A09 9 weeks gestation of pregnancy: Secondary | ICD-10-CM | POA: Diagnosis not present

## 2021-12-12 DIAGNOSIS — O23591 Infection of other part of genital tract in pregnancy, first trimester: Secondary | ICD-10-CM | POA: Diagnosis not present

## 2021-12-12 MED ORDER — CEFADROXIL 500 MG PO CAPS: 500.0000 mg | ORAL_CAPSULE | Freq: Two times a day (BID) | ORAL | 0 refills | Status: AC

## 2021-12-12 MED ORDER — PROMETHAZINE HCL 12.5 MG PO TABS: 12.5000 mg | ORAL_TABLET | Freq: Four times a day (QID) | ORAL | 3 refills | Status: DC | PRN

## 2021-12-12 MED ORDER — ONDANSETRON HCL 4 MG PO TABS: 4.0000 mg | ORAL_TABLET | Freq: Three times a day (TID) | ORAL | 2 refills | Status: DC | PRN

## 2021-12-12 MED ORDER — LIDOCAINE HCL URETHRAL/MUCOSAL 2 % EX GEL
1.0000 "application " | Freq: Once | CUTANEOUS | Status: AC
  Administered 2021-12-12: 1 via TOPICAL
  Filled 2021-12-12: qty 6

## 2021-12-12 MED ORDER — LIDOCAINE HCL 2 % EX GEL: 1.0000 "application " | CUTANEOUS | 0 refills | Status: DC | PRN

## 2021-12-12 NOTE — Progress Notes (Signed)
Written and verbal discharge instructions given; pt verbalized understanding.  

## 2021-12-12 NOTE — MAU Provider Note (Signed)
Chief Complaint: vaginal abscess   Event Date/Time   First Provider Initiated Contact with Patient 12/12/21 0059      SUBJECTIVE HPI: Emily Foster is a 21 y.o. G3P0020 at [redacted]w[redacted]d by LMP who presents to maternity admissions reporting a painful bump on the left side of her vagina x 2 days with onset of fever, n/v, and diarrhea since then. She denies any respiratory or urinary symptoms. She denies sick contacts.  The bump became larger and then broke open today with pus on the bed this morning.     HPI  Past Medical History:  Diagnosis Date   Allergy    Asthma    UTI (urinary tract infection)    Past Surgical History:  Procedure Laterality Date   TOE SURGERY     Social History   Socioeconomic History   Marital status: Single    Spouse name: Not on file   Number of children: Not on file   Years of education: Not on file   Highest education level: Not on file  Occupational History   Not on file  Tobacco Use   Smoking status: Never   Smokeless tobacco: Never  Vaping Use   Vaping Use: Former  Substance and Sexual Activity   Alcohol use: No   Drug use: No   Sexual activity: Yes    Birth control/protection: None  Other Topics Concern   Not on file  Social History Narrative   Planning to go community college   Lives with boyfriend   Social Determinants of Health   Financial Resource Strain: Not on file  Food Insecurity: Not on file  Transportation Needs: Not on file  Physical Activity: Not on file  Stress: Not on file  Social Connections: Not on file  Intimate Partner Violence: Not on file   No current facility-administered medications on file prior to encounter.   Current Outpatient Medications on File Prior to Encounter  Medication Sig Dispense Refill   Cyanocobalamin (VITAMIN B12 PO) Take 1 each by mouth daily in the afternoon.     doxylamine, Sleep, (UNISOM) 25 MG tablet Take 1 tablet (25 mg total) by mouth every 8 (eight) hours as needed. 30 tablet 0    Prenatal Vit-Fe Fumarate-FA (PREPLUS) 27-1 MG TABS Take 1 tablet by mouth daily. 30 tablet 13   pyridOXINE (VITAMIN B-6) 25 MG tablet Take 1 tablet (25 mg total) by mouth every 8 (eight) hours. 30 tablet 0   acetaminophen (TYLENOL) 500 MG tablet Take 1,000 mg by mouth every 6 (six) hours as needed.     albuterol (VENTOLIN HFA) 108 (90 Base) MCG/ACT inhaler TAKE 2 PUFFS BY MOUTH EVERY 6 HOURS AS NEEDED FOR WHEEZE OR SHORTNESS OF BREATH 6.7 each 2   Fluticasone Propionate, Inhal, 100 MCG/BLIST AEPB Inhale 1 puff into the lungs 2 (two) times daily. 28 each 1   Allergies  Allergen Reactions   Other Itching, Rash and Swelling   Soy Allergy Itching, Swelling and Rash    ROS:  Review of Systems  Constitutional:  Negative for chills, fatigue and fever.  Respiratory:  Negative for shortness of breath.   Cardiovascular:  Negative for chest pain.  Gastrointestinal:  Positive for diarrhea, nausea and vomiting.  Genitourinary:  Positive for vaginal pain. Negative for difficulty urinating, dysuria, flank pain, pelvic pain, vaginal bleeding and vaginal discharge.  Neurological:  Negative for dizziness and headaches.  Psychiatric/Behavioral: Negative.       I have reviewed patient's Past Medical Hx, Surgical Hx, Family  Hx, Social Hx, medications and allergies.   Physical Exam  Patient Vitals for the past 24 hrs:  BP Temp Pulse Resp SpO2 Height Weight  12/12/21 0132 (!) 100/59 -- 77 -- -- -- --  12/11/21 2250 110/69 -- 83 -- -- -- --  12/11/21 2233 117/63 -- -- -- -- -- --  12/11/21 2232 -- 99.5 F (37.5 C) 83 17 100 % 5\' 5"  (1.651 m) 73.9 kg   Constitutional: Well-developed, well-nourished female in no acute distress.  Cardiovascular: normal rate Respiratory: normal effort GI: Abd soft, non-tender. Pos BS x 4 MS: Extremities nontender, no edema, normal ROM Neurologic: Alert and oriented x 4.  GU: Neg CVAT.  PELVIC EXAM: On visual inspection, raised area, approximately 4 x 3 cm in size on  left labia/left groin area that is irregular in shape, flesh colored and with small 0.5 cm x 0. 5 cm area of broken skin at the highest point of the raised lesion.     LAB RESULTS Results for orders placed or performed during the hospital encounter of 12/11/21 (from the past 24 hour(s))  Urinalysis, Routine w reflex microscopic Urine, Clean Catch     Status: Abnormal   Collection Time: 12/11/21 10:40 PM  Result Value Ref Range   Color, Urine YELLOW YELLOW   APPearance HAZY (A) CLEAR   Specific Gravity, Urine 1.015 1.005 - 1.030   pH 7.0 5.0 - 8.0   Glucose, UA NEGATIVE NEGATIVE mg/dL   Hgb urine dipstick NEGATIVE NEGATIVE   Bilirubin Urine NEGATIVE NEGATIVE   Ketones, ur 5 (A) NEGATIVE mg/dL   Protein, ur NEGATIVE NEGATIVE mg/dL   Nitrite NEGATIVE NEGATIVE   Leukocytes,Ua NEGATIVE NEGATIVE    --/--/AB POS (05/15 2246)  IMAGING 2247 OB Transvaginal  Result Date: 11/24/2021 CLINICAL DATA:  dating Exam: OBSTETRIC <14 WK 11/26/2021 and TRANSVAGINAL OB US Technique:  Transvaginal ultrasound examination was performed for complete evaluation of the gestation as well at the maternal uterus, adnexal regions, and pelvic cul-de-sac.  Transvaginal technique was performed to assess early pregnancy. Comparison:  11/12/2021 Findings: 11/14/2021 IUP noted Yolk sac: visualized Embryo: visualized Cardiac activity: visualized CRL: 0.51 cm c/w 6 wk 1 d  [facility] EDC:  07/11/2022 Cervix: long and closed Adnexa: WNL bilaterally Subchorionic hemorrhage:  Small 1.2 x 0.3 cm resolving Other findings:  No free fluid Impression: Single living IUP [facility] Recommendations: F/u as clinically indicated   [facility] OB LESS THAN 14 WEEKS WITH OB TRANSVAGINAL  Result Date: 11/12/2021 CLINICAL DATA:  Vaginal bleeding. EXAM: OBSTETRIC <14 WK 11/14/2021 AND TRANSVAGINAL OB US TECHNIQUE: Both transabdominal and transvaginal ultrasound examinations were performed for complete evaluation of the gestation as well as the maternal uterus, adnexal regions, and  pelvic cul-de-sac. Transvaginal technique was performed to assess early pregnancy. COMPARISON:  None Available. FINDINGS: Intrauterine gestational sac: Single Yolk sac:  Not Visualized. Embryo:  Not Visualized. MSD: 5.5 mm   5 w   2 d Subchorionic hemorrhage: None visualized. There is a small amount of fluid in the endometrial canal. Maternal uterus/adnexae: Bilateral ovaries are within normal limits. No free fluid. IMPRESSION: 1. Probable early intrauterine gestational sac, but no yolk sac, fetal pole, or cardiac activity yet visualized. Recommend follow-up quantitative B-HCG levels and follow-up US in 14 days to assess viability. This recommendation follows SRU consensus guidelines: Diagnostic Criteria for Nonviable Pregnancy Early in the First Trimester. [place] Med 20132014. 2. There is a small amount of fluid in the endometrial canal. Electronically Signed  By: Darliss Cheney M.D.   On: 11/12/2021 23:15    MAU Management/MDM: Orders Placed This Encounter  Procedures   Culture, OB Urine   Urinalysis, Routine w reflex microscopic Urine, Clean Catch   Herpes simplex virus (HSV), DNA by PCR Sterile Swab   Discharge patient    Meds ordered this encounter  Medications   lidocaine (XYLOCAINE) 2 % jelly 1 application    promethazine (PHENERGAN) 12.5 MG tablet    Sig: Take 1 tablet (12.5 mg total) by mouth every 6 (six) hours as needed for nausea or vomiting.    Dispense:  30 tablet    Refill:  3    Order Specific Question:   Supervising Provider    Answer:   Adam Phenix [[id number]]   ondansetron (ZOFRAN) 4 MG tablet    Sig: Take 1 tablet (4 mg total) by mouth every 8 (eight) hours as needed for nausea or vomiting.    Dispense:  20 tablet    Refill:  2    Order Specific Question:   Supervising Provider    Answer:   Adam Phenix [[id number]]   cefadroxil (DURICEF) 500 MG capsule    Sig: Take 1 capsule (500 mg total) by mouth 2 (two) times daily for 7 days.    Dispense:  14 capsule     Refill:  0    Order Specific Question:   Supervising Provider    Answer:   Adam Phenix [[id number]]   lidocaine (XYLOCAINE) 2 % jelly    Sig: Apply 1 application  topically as needed.    Dispense:  30 mL    Refill:  0    Order Specific Question:   Supervising Provider    Answer:   Adam Phenix [phone]    Exam consistent with ruptured abscess on left groin area but with small area of broken skin and significant pain on exam, HSV swab collected and sent to lab.  Topical lidocaine given in MAU and Rx for lidocaine, Duricef 500 mg BID x 7 days, and antiemetics sent to pharmacy.  Warm compresses/sitz baths with Epsom salt BID.  Return to MAU with worsening symptoms.  F/U with [facility] for early prenatal care.   ASSESSMENT 1. Vaginal lesion   2. Abscess of female genitalia   3. Fever, unspecified fever cause   4. Nausea and vomiting during pregnancy prior to [redacted] weeks gestation     PLAN Discharge home Allergies as of 12/12/2021       Reactions   Other Itching, Rash, Swelling   Soy Allergy Itching, Swelling, Rash        Medication List     TAKE these medications    acetaminophen 500 MG tablet Commonly known as: TYLENOL Take 1,000 mg by mouth every 6 (six) hours as needed.   albuterol 108 (90 Base) MCG/ACT inhaler Commonly known as: VENTOLIN HFA TAKE 2 PUFFS BY MOUTH EVERY 6 HOURS AS NEEDED FOR WHEEZE OR SHORTNESS OF BREATH   cefadroxil 500 MG capsule Commonly known as: DURICEF Take 1 capsule (500 mg total) by mouth 2 (two) times daily for 7 days.   doxylamine (Sleep) 25 MG tablet Commonly known as: UNISOM Take 1 tablet (25 mg total) by mouth every 8 (eight) hours as needed.   Fluticasone Propionate (Inhal) 100 MCG/BLIST Aepb Inhale 1 puff into the lungs 2 (two) times daily.   lidocaine 2 % jelly Commonly known as: XYLOCAINE Apply 1 application  topically as needed.  ondansetron 4 MG tablet Commonly known as: Zofran Take 1 tablet (4 mg total) by mouth every 8 (eight)  hours as needed for nausea or vomiting.   PrePLUS 27-1 MG Tabs Take 1 tablet by mouth daily.   promethazine 12.5 MG tablet Commonly known as: PHENERGAN Take 1 tablet (12.5 mg total) by mouth every 6 (six) hours as needed for nausea or vomiting.   pyridOXINE 25 MG tablet Commonly known as: VITAMIN B-6 Take 1 tablet (25 mg total) by mouth every 8 (eight) hours.   VITAMIN B12 PO Take 1 each by mouth daily in the afternoon.        Follow-up Information     [facility].   Specialty: Obstetrics and Gynecology Why: The clinic will call you with appointment. Contact information: [address] [phone]        [facility] Follow up.   Specialty: Obstetrics and Gynecology Why: As needed for emergencies Contact information: [address] [id number] [place] [phone]                Sharen CounterLisa Leftwich-Kirby Certified Nurse-Midwife 12/12/2021  2:03 AM

## 2021-12-14 LAB — CULTURE, OB URINE: Culture: <10000 — AB

## 2021-12-15 LAB — HSV DNA BY PCR (REFERENCE LAB)
HSV 1 DNA: NEGATIVE
HSV 2 DNA: NEGATIVE

## 2021-12-16 ENCOUNTER — Telehealth: Payer: BC Managed Care – PPO | Admitting: Emergency Medicine

## 2021-12-16 ENCOUNTER — Encounter: Payer: Self-pay | Admitting: Emergency Medicine

## 2021-12-16 DIAGNOSIS — O26899 Other specified pregnancy related conditions, unspecified trimester: Secondary | ICD-10-CM

## 2021-12-16 DIAGNOSIS — N898 Other specified noninflammatory disorders of vagina: Secondary | ICD-10-CM

## 2021-12-16 DIAGNOSIS — R102 Pelvic and perineal pain: Secondary | ICD-10-CM

## 2021-12-16 NOTE — Progress Notes (Signed)
Based on what you shared with me, I feel your condition warrants further evaluation as soon as possible at an Emergency department. I am concerned that you are having vaginal pain, abdominal pain, vaginal discharge, as well as vaginal sores during pregnancy.  Please seek in person evaluation for further care and treatment   NOTE: There will be NO CHARGE for this eVisit   If you are having a true medical emergency please call 911.      Emergency Department-[facility]  Get Driving Directions  [phone]  [address]  [place], [place] [address]  Open 24/7/365      [facility] Emergency Department at [facility]  Get Driving Directions  [address]  [place], [place] [address]  Open 24/7/365    Emergency Department- [facility]  Get Driving Directions  [phone]  [address]  [place], [place] [address]  Open 24/7/365      [facility] Emergency Department at [facility]  Get Driving Directions  [phone]  [address]  [place], [place] [address]  Open 24/7/365    [facility]  Emergency Department- [facility]  Get Driving Directions  [phone]  [address]  [place], [place] [address]  Open 24/7/365    [place]  Emergency Department- [facility]  Get Driving Directions  [address]  [place], [place] [address]  Open 24/7/365    [facility]  Emergency Department- [facility]  Get Driving Directions  [phone]  [address]  [place], [place] [address]  Open 24/7/365

## 2021-12-18 ENCOUNTER — Telehealth: Payer: BC Managed Care – PPO | Admitting: Physician Assistant

## 2021-12-18 ENCOUNTER — Inpatient Hospital Stay (HOSPITAL_COMMUNITY)
Admission: AD | Admit: 2021-12-18 | Discharge: 2021-12-18 | Disposition: A | Discharge status: Home / Self Care | Payer: BC Managed Care – PPO | Attending: Obstetrics and Gynecology | Admitting: Obstetrics and Gynecology

## 2021-12-18 ENCOUNTER — Other Ambulatory Visit: Payer: Self-pay

## 2021-12-18 ENCOUNTER — Encounter (HOSPITAL_COMMUNITY): Payer: Self-pay | Admitting: Obstetrics and Gynecology

## 2021-12-18 DIAGNOSIS — K117 Disturbances of salivary secretion: Secondary | ICD-10-CM | POA: Diagnosis not present

## 2021-12-18 DIAGNOSIS — O210 Mild hyperemesis gravidarum: Secondary | ICD-10-CM | POA: Diagnosis not present

## 2021-12-18 DIAGNOSIS — Z3A10 10 weeks gestation of pregnancy: Secondary | ICD-10-CM | POA: Insufficient documentation

## 2021-12-18 DIAGNOSIS — O99611 Diseases of the digestive system complicating pregnancy, first trimester: Secondary | ICD-10-CM | POA: Diagnosis not present

## 2021-12-18 DIAGNOSIS — O219 Vomiting of pregnancy, unspecified: Secondary | ICD-10-CM

## 2021-12-18 LAB — URINALYSIS, ROUTINE W REFLEX MICROSCOPIC
Bilirubin Urine: NEGATIVE
Glucose, UA: NEGATIVE mg/dL
Hgb urine dipstick: NEGATIVE
Ketones, ur: NEGATIVE mg/dL
Leukocytes,Ua: NEGATIVE
Nitrite: NEGATIVE
Protein, ur: 30 mg/dL — AB
Specific Gravity, Urine: 1.025 (ref 1.005–1.030)
pH: 5 (ref 5.0–8.0)

## 2021-12-18 MED ORDER — GLYCOPYRROLATE 1 MG PO TABS: 1.0000 mg | ORAL_TABLET | Freq: Three times a day (TID) | ORAL | 0 refills | Status: DC

## 2021-12-18 MED ORDER — SODIUM CHLORIDE 0.9 % IV SOLN
8.0000 mg | Freq: Once | INTRAVENOUS | Status: AC
  Administered 2021-12-18: 8 mg via INTRAVENOUS
  Filled 2021-12-18: qty 4

## 2021-12-18 MED ORDER — FAMOTIDINE IN NACL 20-0.9 MG/50ML-% IV SOLN
20.0000 mg | Freq: Once | INTRAVENOUS | Status: AC
  Administered 2021-12-18: 20 mg via INTRAVENOUS
  Filled 2021-12-18: qty 50

## 2021-12-18 MED ORDER — LACTATED RINGERS IV BOLUS
1000.0000 mL | Freq: Once | INTRAVENOUS | Status: AC
  Administered 2021-12-18: 1000 mL via INTRAVENOUS

## 2021-12-18 NOTE — MAU Note (Signed)
Emily Foster is a 21 y.o. at [redacted]w[redacted]d here in MAU reporting: ongoing nausea and vomiting. States she has lost count of how many times she has vomited in the last 24 hours. Took phenergan around 3 this AM. Has not picked up zofran. States took [employer] and B6 around 3 also.   Onset of complaint: ongoing  Pain score: 0/10  Vitals:   12/18/21 0802  BP: 118/73  Pulse: 82  Resp: 16  Temp: 98.5 F (36.9 C)  SpO2: 98%     FHT:167  Lab orders placed from triage: UA

## 2021-12-18 NOTE — MAU Provider Note (Signed)
History     CSN: [id number]  Arrival date and time: 12/18/21 2774   Event Date/Time   First Provider Initiated Contact with Patient 12/18/21 [phone]      Chief Complaint  Patient presents with  . Nausea   HPI [name] is a 21 y.o. year old G71P0020 female at [redacted]w[redacted]d weeks gestation who presents to MAU reporting on-going N/V. She took Phenergan, Unisom, and Vitamin B-6 @ 0300. She states, "Those medications don't work." She did not pick up the prescription that was sent for Zofran d/t transportation issues. She reports she has "vomited so much she has lost count."   OB History     Gravida  3   Para  0   Term  0   Preterm  0   AB  2   Living  0      SAB  2   IAB  0   Ectopic  0   Multiple  0   Live Births  0           Past Medical History:  Diagnosis Date  . Allergy   . Asthma   . UTI (urinary tract infection)     Past Surgical History:  Procedure Laterality Date  . TOE SURGERY      Family History  Problem Relation Age of Onset  . Diabetes Mother   . Diabetes Paternal Grandmother   . Diabetes Paternal Grandfather     Social History   Tobacco Use  . Smoking status: Never  . Smokeless tobacco: Never  Vaping Use  . Vaping Use: Former  Substance Use Topics  . Alcohol use: No  . Drug use: No    Allergies:  Allergies  Allergen Reactions  . Soy Allergy Itching, Swelling and Rash    Medications Prior to Admission  Medication Sig Dispense Refill Last Dose  . acetaminophen (TYLENOL) 500 MG tablet Take 1,000 mg by mouth every 6 (six) hours as needed.     Marland Kitchen albuterol (VENTOLIN HFA) 108 (90 Base) MCG/ACT inhaler TAKE 2 PUFFS BY MOUTH EVERY 6 HOURS AS NEEDED FOR WHEEZE OR SHORTNESS OF BREATH 6.7 each 2   . cefadroxil (DURICEF) 500 MG capsule Take 1 capsule (500 mg total) by mouth 2 (two) times daily for 7 days. 14 capsule 0   . Cyanocobalamin (VITAMIN B12 PO) Take 1 each by mouth daily in the afternoon.     Marland Kitchen doxylamine, Sleep, (UNISOM)  25 MG tablet Take 1 tablet (25 mg total) by mouth every 8 (eight) hours as needed. 30 tablet 0   . Fluticasone Propionate, Inhal, 100 MCG/BLIST AEPB Inhale 1 puff into the lungs 2 (two) times daily. 28 each 1   . lidocaine (XYLOCAINE) 2 % jelly Apply 1 application  topically as needed. 30 mL 0   . ondansetron (ZOFRAN) 4 MG tablet Take 1 tablet (4 mg total) by mouth every 8 (eight) hours as needed for nausea or vomiting. 20 tablet 2   . Prenatal Vit-Fe Fumarate-FA (PREPLUS) 27-1 MG TABS Take 1 tablet by mouth daily. 30 tablet 13   . promethazine (PHENERGAN) 12.5 MG tablet Take 1 tablet (12.5 mg total) by mouth every 6 (six) hours as needed for nausea or vomiting. 30 tablet 3   . pyridOXINE (VITAMIN B-6) 25 MG tablet Take 1 tablet (25 mg total) by mouth every 8 (eight) hours. 30 tablet 0     Review of Systems  Constitutional:  Positive for appetite change.  HENT: Negative.  Eyes: Negative.   Respiratory: Negative.    Cardiovascular: Negative.   Gastrointestinal:  Positive for nausea and vomiting.  Endocrine: Negative.   Genitourinary: Negative.   Musculoskeletal: Negative.   Skin: Negative.   Allergic/Immunologic: Negative.   Neurological:  Positive for weakness.  Hematological: Negative.   Psychiatric/Behavioral: Negative.     Physical Exam   Blood pressure 118/73, pulse 82, temperature 98.5 F (36.9 C), temperature source Oral, resp. rate 16, height 5\' 5"  (1.651 m), weight 73.3 kg, last menstrual period 10/04/2021, SpO2 98 %.  Physical Exam  MAU Course  Procedures  MDM CCUA LR Bolus Zofran 8 mg IVPB -- N/V resolved Pepcid 20 mg IVPB PO Challenge -- patient tolerated well    Results for orders placed or performed during the hospital encounter of 12/18/21 (from the past 24 hour(s))  Urinalysis, Routine w reflex microscopic Urine, Clean Catch     Status: Abnormal   Collection Time: 12/18/21  7:56 AM  Result Value Ref Range   Color, Urine YELLOW YELLOW   APPearance HAZY  (A) CLEAR   Specific Gravity, Urine 1.025 1.005 - 1.030   pH 5.0 5.0 - 8.0   Glucose, UA NEGATIVE NEGATIVE mg/dL   Hgb urine dipstick NEGATIVE NEGATIVE   Bilirubin Urine NEGATIVE NEGATIVE   Ketones, ur NEGATIVE NEGATIVE mg/dL   Protein, ur 30 (A) NEGATIVE mg/dL   Nitrite NEGATIVE NEGATIVE   Leukocytes,Ua NEGATIVE NEGATIVE   RBC / HPF 0-5 0 - 5 RBC/hpf   WBC, UA 0-5 0 - 5 WBC/hpf   Bacteria, UA RARE (A) NONE SEEN   Squamous Epithelial / LPF 0-5 0 - 5   Mucus PRESENT     Assessment and Plan  ***  12/20/21, CNM 12/18/2021, 9:00 AM

## 2021-12-18 NOTE — Discharge Instructions (Signed)
[facility] [employer] for [employer] at [employer] for Women             [address] [place], [place] [address] [phone]  Center for [facility] at [facility]                                                             [address], [place], [place], [address] [phone]  Center for [facility] at [facility] [address], [address], [place], [place], [address] [phone]  Center for [facility] [address], [place], [place], [address] [phone]  Center for [facility] at [facility]                                 [address] [place], [place], [place], [address] [phone]  Center for [facility]                                    [address], [place], [place], [address] [phone]  Center for [facility] at [facility] [address], [address], [place], [place], [address]                              [facility] [address], [address], [place], [place], [address] [phone]  [facility]         Phone: [phone]  [facility]      Phone: [phone]   [facility]      Phone: [phone]  [facility] Health Department-Family Planning         Phone: [phone]   [facility] Health Department-Maternity    Phone: [phone]  [facility]      Phone: [phone]  [facility]     Phone: [phone]  [facility]        Phone: [phone]  **Practices in [place] definitely take Medicaid

## 2021-12-18 NOTE — Progress Notes (Signed)
For the safety of you and your child, I recommend a face to face office visit with a health care provider.  After reviewing your E-Visit request, I recommend that you consult your OB/GYN or pediatrician for medical advice in relation to your condition and prescription medications while pregnant or breastfeeding, giving you are already being treated for the nausea/vomiting and having breakthrough symptoms despite treatment given.   NOTE:  There will be NO CHARGE for this eVisit  If you are having a true medical emergency please call 911.    For an urgent face to face visit, [facility] has six urgent care centers for your convenience:     [facility] Health Urgent Care Center at [facility] Directions [phone] [address] [place], [place] [address]    [facility] Health Urgent Care Center [facility]) Get Driving Directions [phone] [address] [place], [place] [address]  [facility] Health Urgent Care Center [facility]) Get Driving Directions [phone] [address] [place],  [place]  [address]  [facility] Health Urgent Care at [facility] Get Driving Directions [phone] [address] [place], [place] [address]   [facility] Health Urgent Care at [facility] Get Driving Directions  [phone] [address] [place], [place] [address]   [facility] Health Urgent Care at [facility] Directions [phone] [address] [place], [place] [address]  Your MyChart E-visit questionnaire answers were reviewed by a board certified advanced clinical practitioner to complete your personal care plan based on your specific symptoms.  Thank you for using e-Visits.

## 2021-12-27 ENCOUNTER — Encounter (HOSPITAL_COMMUNITY): Payer: Self-pay | Admitting: Obstetrics and Gynecology

## 2021-12-27 ENCOUNTER — Telehealth: Payer: BC Managed Care – PPO | Admitting: Family Medicine

## 2021-12-27 ENCOUNTER — Inpatient Hospital Stay (HOSPITAL_COMMUNITY)
Admission: AD | Admit: 2021-12-27 | Discharge: 2021-12-28 | Disposition: A | Discharge status: Home / Self Care | Payer: BC Managed Care – PPO | Attending: Obstetrics and Gynecology | Admitting: Obstetrics and Gynecology

## 2021-12-27 DIAGNOSIS — K649 Unspecified hemorrhoids: Secondary | ICD-10-CM

## 2021-12-27 DIAGNOSIS — K5900 Constipation, unspecified: Secondary | ICD-10-CM | POA: Insufficient documentation

## 2021-12-27 DIAGNOSIS — O99611 Diseases of the digestive system complicating pregnancy, first trimester: Secondary | ICD-10-CM | POA: Insufficient documentation

## 2021-12-27 DIAGNOSIS — Z3A12 12 weeks gestation of pregnancy: Secondary | ICD-10-CM | POA: Insufficient documentation

## 2021-12-27 DIAGNOSIS — R109 Unspecified abdominal pain: Secondary | ICD-10-CM | POA: Diagnosis not present

## 2021-12-27 DIAGNOSIS — O26891 Other specified pregnancy related conditions, first trimester: Secondary | ICD-10-CM | POA: Insufficient documentation

## 2021-12-27 DIAGNOSIS — R1084 Generalized abdominal pain: Secondary | ICD-10-CM | POA: Diagnosis not present

## 2021-12-27 LAB — URINALYSIS, ROUTINE W REFLEX MICROSCOPIC
Bacteria, UA: NONE SEEN
Bilirubin Urine: NEGATIVE
Glucose, UA: NEGATIVE mg/dL
Hgb urine dipstick: NEGATIVE
Ketones, ur: NEGATIVE mg/dL
Leukocytes,Ua: NEGATIVE
Nitrite: NEGATIVE
Protein, ur: NEGATIVE mg/dL
Specific Gravity, Urine: 1.021 (ref 1.005–1.030)
pH: 5 (ref 5.0–8.0)

## 2021-12-27 MED ORDER — HYOSCYAMINE SULFATE 0.125 MG SL SUBL
0.2500 mg | SUBLINGUAL_TABLET | Freq: Once | SUBLINGUAL | Status: AC
  Administered 2021-12-27: 0.25 mg via SUBLINGUAL
  Filled 2021-12-27: qty 2

## 2021-12-27 MED ORDER — SCOPOLAMINE 1 MG/3DAYS TD PT72
1.0000 | MEDICATED_PATCH | Freq: Once | TRANSDERMAL | Status: DC
  Administered 2021-12-27: 1.5 mg via TRANSDERMAL
  Filled 2021-12-27: qty 1

## 2021-12-27 MED ORDER — SORBITOL 70 % SOLN
960.0000 mL | TOPICAL_OIL | Freq: Once | ORAL | Status: AC
  Administered 2021-12-27: 960 mL via RECTAL
  Filled 2021-12-27: qty 473

## 2021-12-27 MED ORDER — HYDROCORT-PRAMOXINE (PERIANAL) 1-1 % EX FOAM
1.0000 | Freq: Once | CUTANEOUS | Status: AC
  Administered 2021-12-27: 1 via RECTAL
  Filled 2021-12-27: qty 10

## 2021-12-27 MED ORDER — SIMETHICONE 80 MG PO CHEW
160.0000 mg | CHEWABLE_TABLET | Freq: Once | ORAL | Status: AC
  Administered 2021-12-27: 160 mg via ORAL
  Filled 2021-12-27: qty 2

## 2021-12-27 NOTE — MAU Provider Note (Addendum)
History     CSN: [id number]  Arrival date and time: 12/27/21 1956   None     Chief Complaint  Patient presents with   Abdominal Pain   "ball" on tail bone   Emily Foster, a  21 y.o. [id number] at [redacted]w[redacted]d presents to MAU with complaints of constipation and left sided abdominal pain. Patient states that she has not pooped in 3 weeks and her abdominal pain has worsened "over the last few days." She has increased her water intake and attempted miralax without relief. She also complains of hemorrhoidal pain and rectal spotting from straining. States "I just feel full."  She denies vaginal bleeding and abnormal vaginal discharge.         Abdominal Pain This is a new problem. The current episode started in the past 7 days. The onset quality is gradual. The problem occurs intermittently. The quality of the pain is described as cramping. The pain does not radiate. Associated symptoms include constipation and nausea. Pertinent negatives include no diarrhea, fever or vomiting. Nothing relieves the symptoms. Treatments tried: miralax. The treatment provided no improvement relief.    OB History     Gravida  3   Para  0   Term  0   Preterm  0   AB  2   Living  0      SAB  2   IAB  0   Ectopic  0   Multiple  0   Live Births  0           Past Medical History:  Diagnosis Date   Allergy    Asthma    UTI (urinary tract infection)     Past Surgical History:  Procedure Laterality Date   TOE SURGERY      Family History  Problem Relation Age of Onset   Diabetes Mother    Diabetes Paternal Grandmother    Diabetes Paternal Grandfather     Social History   Tobacco Use   Smoking status: Never   Smokeless tobacco: Never  Vaping Use   Vaping Use: Former  Substance Use Topics   Alcohol use: No   Drug use: No    Allergies:  Allergies  Allergen Reactions   Soy Allergy Itching, Swelling and Rash    Medications Prior to Admission  Medication Sig Dispense Refill  Last Dose   doxylamine, Sleep, (UNISOM) 25 MG tablet Take 1 tablet (25 mg total) by mouth every 8 (eight) hours as needed. 30 tablet 0 12/26/2021   ondansetron (ZOFRAN) 4 MG tablet Take 1 tablet (4 mg total) by mouth every 8 (eight) hours as needed for nausea or vomiting. 20 tablet 2 12/26/2021   Prenatal Vit-Fe Fumarate-FA (PREPLUS) 27-1 MG TABS Take 1 tablet by mouth daily. 30 tablet 13 12/26/2021   promethazine (PHENERGAN) 12.5 MG tablet Take 1 tablet (12.5 mg total) by mouth every 6 (six) hours as needed for nausea or vomiting. 30 tablet 3 12/26/2021   acetaminophen (TYLENOL) 500 MG tablet Take 1,000 mg by mouth every 6 (six) hours as needed.      albuterol (VENTOLIN HFA) 108 (90 Base) MCG/ACT inhaler TAKE 2 PUFFS BY MOUTH EVERY 6 HOURS AS NEEDED FOR WHEEZE OR SHORTNESS OF BREATH 6.7 each 2 More than a month   Cyanocobalamin (VITAMIN B12 PO) Take 1 each by mouth daily in the afternoon.      Fluticasone Propionate, Inhal, 100 MCG/BLIST AEPB Inhale 1 puff into the lungs 2 (two) times daily. 28 each  1    glycopyrrolate (ROBINUL) 1 MG tablet Take 1 tablet (1 mg total) by mouth 3 (three) times daily. 90 tablet 0    lidocaine (XYLOCAINE) 2 % jelly Apply 1 application  topically as needed. 30 mL 0    pyridOXINE (VITAMIN B-6) 25 MG tablet Take 1 tablet (25 mg total) by mouth every 8 (eight) hours. 30 tablet 0     Review of Systems  Constitutional:  Positive for appetite change. Negative for chills and fever.  Gastrointestinal:  Positive for abdominal distention, abdominal pain, constipation, nausea and rectal pain. Negative for diarrhea and vomiting.  Genitourinary:  Negative for difficulty urinating and pelvic pain.   Physical Exam   Blood pressure 111/72, pulse 72, temperature 98.7 F (37.1 C), resp. rate 17, height 5\' 5"  (1.651 m), weight 71.7 kg, last menstrual period 10/04/2021, SpO2 100 %.  Physical Exam Vitals and nursing note reviewed.  HENT:     Head: Normocephalic.  Pulmonary:      Effort: Pulmonary effort is normal. No respiratory distress.  Abdominal:     General: Bowel sounds are decreased. There is no distension.     Palpations: Abdomen is rigid.     Tenderness: There is abdominal tenderness in the left upper quadrant. There is no guarding.     Comments: Pregnant. Abdomen rigid on LLQ and LUQ. Soft on RUQ, RLQ.   Skin:    General: Skin is warm and dry.  Neurological:     Mental Status: She is alert and oriented to person, place, and time.  Psychiatric:        Mood and Affect: Mood normal.     MAU Course  Procedures Orders Placed This Encounter  Procedures   Urinalysis, Routine w reflex microscopic Urine, Clean Catch    Standing Status:   Standing    Number of Occurrences:   1   Meds ordered this encounter  Medications   hydrocortisone-pramoxine (PROCTOFOAM-HC) rectal foam 1 applicator   sorbitol, milk of mag, mineral oil, glycerin (SMOG) enema   scopolamine (TRANSDERM-SCOP) 1 MG/3DAYS 1.5 mg    MDM First attempt at [facility] enema unsuccessful.  Patient began vomiting. Scop Patch ordered for nausea and vomiting during enema.  Plan to re-try [facility] enema and send patient home with instructions for Miralax Clean out.   Care transferred to [facility]. [place], CNM @ 2200pm.   Shantonette [place]) Danella Deis, MSN, CNM  Center for [facility]  12/27/21 10:37 PM   Got some results from enema, but developed abdominal cramping afterward We gave her some Levsin and Simethicone with little relief  She began vomiting and crying, so we gave her an IV with fluids, Phenergan and one dose of Dilaudid to help her relax and stop vomiting  This provided relief and she was able to rest.   Discussed will discharge home with instructions for Miralax clean out (per Dr 12/29/21 protocol)  A:  Single IUP at [redacted]w[redacted]d       Constipation, severe       Abdominal cramping       Hemorrhoids  P;  Discharge home      Rx Miralax      Rx Proctofoam [facility]      Rx Scopolamine for nausea         High fiber diet with water    Encouraged to return if she develops worsening of symptoms, increase in pain, fever, or other concerning symptoms.   [facility], CNM

## 2021-12-27 NOTE — Progress Notes (Signed)
[place]   Having hemorrhoid/rectal bleeding concerns [redacted] weeks pregnant, advised to contact her OB.

## 2021-12-27 NOTE — MAU Note (Signed)
.  Emily Foster is a 21 y.o. at [redacted]w[redacted]d here in MAU reporting no BM for 3wks and it's causing her stomach to hurt. Has a "ball" here (points to lower back). States it is not around rectum but like her tail bone she noticed 2 days ago. Has never had this before.  LMP:  Onset of complaint: 2 days Pain score: 10  Vitals:   12/27/21 2028 12/27/21 2030  BP:  117/79  Pulse: 89   Resp: 17   Temp: 98.7 F (37.1 C)   SpO2: 100%      FHT:160 Lab orders placed from triage:  u/a

## 2021-12-27 NOTE — MAU Provider Note (Incomplete Revision)
History     CSN: [id number]  Arrival date and time: 12/27/21 1956   None     Chief Complaint  Patient presents with   Abdominal Pain   "ball" on tail bone   [place] Bunyan, a  21 y.o. [id number] at [redacted]w[redacted]d presents to MAU with complaints of constipation and left sided abdominal pain. Patient states that she has not pooped in 3 weeks and her abdominal pain has worsened "over the last few days." She has increased her water intake and attempted miralax without relief. She also complains of hemorrhoidal pain and rectal spotting from straining. States "I just feel full."  She denies vaginal bleeding and abnormal vaginal discharge.         Abdominal Pain Associated symptoms include constipation and nausea. Pertinent negatives include no diarrhea, fever or vomiting.    OB History     Gravida  3   Para  0   Term  0   Preterm  0   AB  2   Living  0      SAB  2   IAB  0   Ectopic  0   Multiple  0   Live Births  0           Past Medical History:  Diagnosis Date   Allergy    Asthma    UTI (urinary tract infection)     Past Surgical History:  Procedure Laterality Date   TOE SURGERY      Family History  Problem Relation Age of Onset   Diabetes Mother    Diabetes Paternal Grandmother    Diabetes Paternal Grandfather     Social History   Tobacco Use   Smoking status: Never   Smokeless tobacco: Never  Vaping Use   Vaping Use: Former  Substance Use Topics   Alcohol use: No   Drug use: No    Allergies:  Allergies  Allergen Reactions   Soy Allergy Itching, Swelling and Rash    Medications Prior to Admission  Medication Sig Dispense Refill Last Dose   doxylamine, Sleep, (UNISOM) 25 MG tablet Take 1 tablet (25 mg total) by mouth every 8 (eight) hours as needed. 30 tablet 0 12/26/2021   ondansetron (ZOFRAN) 4 MG tablet Take 1 tablet (4 mg total) by mouth every 8 (eight) hours as needed for nausea or vomiting. 20 tablet 2 12/26/2021   Prenatal Vit-Fe  Fumarate-FA (PREPLUS) 27-1 MG TABS Take 1 tablet by mouth daily. 30 tablet 13 12/26/2021   promethazine (PHENERGAN) 12.5 MG tablet Take 1 tablet (12.5 mg total) by mouth every 6 (six) hours as needed for nausea or vomiting. 30 tablet 3 12/26/2021   acetaminophen (TYLENOL) 500 MG tablet Take 1,000 mg by mouth every 6 (six) hours as needed.      albuterol (VENTOLIN HFA) 108 (90 Base) MCG/ACT inhaler TAKE 2 PUFFS BY MOUTH EVERY 6 HOURS AS NEEDED FOR WHEEZE OR SHORTNESS OF BREATH 6.7 each 2 More than a month   Cyanocobalamin (VITAMIN B12 PO) Take 1 each by mouth daily in the afternoon.      Fluticasone Propionate, Inhal, 100 MCG/BLIST AEPB Inhale 1 puff into the lungs 2 (two) times daily. 28 each 1    glycopyrrolate (ROBINUL) 1 MG tablet Take 1 tablet (1 mg total) by mouth 3 (three) times daily. 90 tablet 0    lidocaine (XYLOCAINE) 2 % jelly Apply 1 application  topically as needed. 30 mL 0    pyridOXINE (VITAMIN B-6) 25 MG  tablet Take 1 tablet (25 mg total) by mouth every 8 (eight) hours. 30 tablet 0     Review of Systems  Constitutional:  Positive for appetite change. Negative for chills and fever.  Gastrointestinal:  Positive for abdominal distention, abdominal pain, constipation, nausea and rectal pain. Negative for diarrhea and vomiting.  Genitourinary:  Negative for difficulty urinating and pelvic pain.   Physical Exam   Blood pressure 111/72, pulse 72, temperature 98.7 F (37.1 C), resp. rate 17, height 5\' 5"  (1.651 m), weight 71.7 kg, last menstrual period 10/04/2021, SpO2 100 %.  Physical Exam Vitals and nursing note reviewed.  HENT:     Head: Normocephalic.  Pulmonary:     Effort: Pulmonary effort is normal. No respiratory distress.  Abdominal:     General: Bowel sounds are decreased. There is no distension.     Palpations: Abdomen is rigid.     Tenderness: There is abdominal tenderness in the left upper quadrant. There is no guarding.     Comments: Pregnant. Abdomen rigid on LLQ  and LUQ. Soft on RUQ, RLQ.   Skin:    General: Skin is warm and dry.  Neurological:     Mental Status: She is alert and oriented to person, place, and time.  Psychiatric:        Mood and Affect: Mood normal.     MAU Course  Procedures Orders Placed This Encounter  Procedures   Urinalysis, Routine w reflex microscopic Urine, Clean Catch    Standing Status:   Standing    Number of Occurrences:   1   Meds ordered this encounter  Medications   hydrocortisone-pramoxine (PROCTOFOAM-HC) rectal foam 1 applicator   sorbitol, milk of mag, mineral oil, glycerin (SMOG) enema   scopolamine (TRANSDERM-SCOP) 1 MG/3DAYS 1.5 mg    MDM First attempt at [facility] enema unsuccessful.  Patient began vomiting. Scop Patch ordered for nausea and vomiting during enema.  Plan to re-try [facility] enema and send patient home with instructions for Miralax Clean out.   Care transferred to [facility]. [facility], CNM @ 2200pm.   Shantonette [place]) Danella Deis, MSN, CNM  Center for [facility]  12/27/21 10:37 PM

## 2021-12-27 NOTE — Discharge Instructions (Signed)

## 2021-12-28 ENCOUNTER — Inpatient Hospital Stay (EMERGENCY_DEPARTMENT_HOSPITAL)
Admission: AD | Admit: 2021-12-28 | Discharge: 2021-12-28 | Disposition: A | Discharge status: Home / Self Care | Payer: BC Managed Care – PPO | Source: Home / Self Care | Attending: Family Medicine | Admitting: Family Medicine

## 2021-12-28 ENCOUNTER — Other Ambulatory Visit: Payer: Self-pay

## 2021-12-28 ENCOUNTER — Encounter (HOSPITAL_COMMUNITY): Payer: Self-pay | Admitting: Family Medicine

## 2021-12-28 ENCOUNTER — Telehealth: Payer: Self-pay | Admitting: *Deleted

## 2021-12-28 DIAGNOSIS — R1084 Generalized abdominal pain: Secondary | ICD-10-CM | POA: Diagnosis not present

## 2021-12-28 DIAGNOSIS — K649 Unspecified hemorrhoids: Secondary | ICD-10-CM | POA: Diagnosis not present

## 2021-12-28 DIAGNOSIS — K5900 Constipation, unspecified: Secondary | ICD-10-CM

## 2021-12-28 DIAGNOSIS — O26891 Other specified pregnancy related conditions, first trimester: Secondary | ICD-10-CM | POA: Insufficient documentation

## 2021-12-28 DIAGNOSIS — Z3A12 12 weeks gestation of pregnancy: Secondary | ICD-10-CM

## 2021-12-28 DIAGNOSIS — M545 Low back pain, unspecified: Secondary | ICD-10-CM | POA: Insufficient documentation

## 2021-12-28 DIAGNOSIS — R109 Unspecified abdominal pain: Secondary | ICD-10-CM | POA: Insufficient documentation

## 2021-12-28 DIAGNOSIS — O99611 Diseases of the digestive system complicating pregnancy, first trimester: Secondary | ICD-10-CM | POA: Insufficient documentation

## 2021-12-28 LAB — URINALYSIS, ROUTINE W REFLEX MICROSCOPIC
Bilirubin Urine: NEGATIVE
Glucose, UA: NEGATIVE mg/dL
Hgb urine dipstick: NEGATIVE
Ketones, ur: 5 mg/dL — AB
Leukocytes,Ua: NEGATIVE
Nitrite: NEGATIVE
Protein, ur: NEGATIVE mg/dL
Specific Gravity, Urine: 1.016 (ref 1.005–1.030)
pH: 5 (ref 5.0–8.0)

## 2021-12-28 LAB — WET PREP, GENITAL
Sperm: NONE SEEN
Trich, Wet Prep: NONE SEEN
WBC, Wet Prep HPF POC: <10 (ref <10)
Yeast Wet Prep HPF POC: NONE SEEN

## 2021-12-28 MED ORDER — ACETAMINOPHEN 500 MG PO TABS
1000.0000 mg | ORAL_TABLET | Freq: Once | ORAL | Status: AC
  Administered 2021-12-28: 1000 mg via ORAL
  Filled 2021-12-28: qty 2

## 2021-12-28 MED ORDER — SCOPOLAMINE 1 MG/3DAYS TD PT72: 1.0000 | MEDICATED_PATCH | TRANSDERMAL | 12 refills | Status: DC

## 2021-12-28 MED ORDER — SODIUM CHLORIDE 0.9 % IV SOLN
25.0000 mg | Freq: Once | INTRAVENOUS | Status: AC
  Administered 2021-12-28: 25 mg via INTRAVENOUS
  Filled 2021-12-28 (×2): qty 1

## 2021-12-28 MED ORDER — HALOPERIDOL LACTATE 5 MG/ML IJ SOLN
2.0000 mg | Freq: Once | INTRAMUSCULAR | Status: AC
  Administered 2021-12-28: 2 mg via INTRAVENOUS
  Filled 2021-12-28: qty 0.4

## 2021-12-28 MED ORDER — POLYETHYLENE GLYCOL 3350 17 G PO PACK
17.0000 g | PACK | Freq: Every day | ORAL | 0 refills | Status: DC
Start: 2021-12-28 — End: 2022-01-24

## 2021-12-28 MED ORDER — LACTATED RINGERS IV BOLUS
1000.0000 mL | Freq: Once | INTRAVENOUS | Status: AC
  Administered 2021-12-28: 1000 mL via INTRAVENOUS

## 2021-12-28 MED ORDER — HYDROMORPHONE HCL 1 MG/ML IJ SOLN: 0.5000 mg | Freq: Once | INTRAMUSCULAR | Status: DC

## 2021-12-28 MED ORDER — LACTATED RINGERS IV SOLN
Freq: Once | INTRAVENOUS | Status: AC
Start: 2021-12-28 — End: 2021-12-28

## 2021-12-28 MED ORDER — HYDROMORPHONE HCL 1 MG/ML IJ SOLN
0.5000 mg | Freq: Once | INTRAMUSCULAR | Status: AC
  Administered 2021-12-28: 0.5 mg via INTRAVENOUS
  Filled 2021-12-28: qty 1

## 2021-12-28 MED ORDER — DIPHENHYDRAMINE HCL 50 MG/ML IJ SOLN
25.0000 mg | Freq: Once | INTRAMUSCULAR | Status: AC
  Administered 2021-12-28: 25 mg via INTRAVENOUS
  Filled 2021-12-28: qty 1

## 2021-12-28 MED ORDER — DOCUSATE SODIUM 100 MG PO CAPS: 100.0000 mg | ORAL_CAPSULE | Freq: Two times a day (BID) | ORAL | 2 refills | Status: DC | PRN

## 2021-12-28 NOTE — MAU Note (Signed)
Emily Foster is a 21 y.o. at [redacted]w[redacted]d here in MAU reporting: she was seen in MAU last night for constipation.  Reports was given Miralax, had good results from Miralax.  States felt sick during the night, had N/V and noticed rectal bleeding.  States she was "pouring blood out my butt".  Also c/o lower back & abdominal pain.  Reports pain in abdominal is sharp and constant and lower back pain is constant and aching.  Onset of complaint: today Pain score: 10/10 abdomen & 8/10 back Vitals:   12/28/21 1239  BP: 121/82  Pulse: (!) 104  Resp: 19  Temp: 99.1 F (37.3 C)  SpO2: 97%     FHT: 169 bpm Lab orders placed from triage:   UA

## 2021-12-28 NOTE — MAU Provider Note (Signed)
History     CSN: [id number]  Arrival date and time: 12/28/21 1203   Event Date/Time   First Provider Initiated Contact with Patient 12/28/21 1303      Chief Complaint  Patient presents with   Abdominal Pain   HPI Emily Foster is a 21 y.o. who presents to MAU with chief complaints of abdominal pain and low back pain 2/2 recurrent constipation. Of note, patient was evaluated in MAU last night for these complaints. She endorses multiple bowel movements during and after her discharge.   Patient also reports rectal bleeding earlier today. She reports that the bleeding occurred during recurrent episodes of vomiting.  Patient reports remote THC use. She states she was treated for Cannabis Hyperemesis and afterwards discontinued use. She last used THC about one month ago.  OB History     Gravida  3   Para  0   Term  0   Preterm  0   AB  2   Living  0      SAB  2   IAB  0   Ectopic  0   Multiple  0   Live Births  0           Past Medical History:  Diagnosis Date   Allergy    Asthma    UTI (urinary tract infection)     Past Surgical History:  Procedure Laterality Date   TOE SURGERY      Family History  Problem Relation Age of Onset   Diabetes Mother    Diabetes Paternal Grandmother    Diabetes Paternal Grandfather     Social History   Tobacco Use   Smoking status: Never   Smokeless tobacco: Never  Vaping Use   Vaping Use: Former  Substance Use Topics   Alcohol use: No   Drug use: No    Allergies:  Allergies  Allergen Reactions   Soy Allergy Itching, Swelling and Rash    No medications prior to admission.    Review of Systems  Gastrointestinal:  Positive for abdominal pain, constipation and rectal pain.  Musculoskeletal:  Positive for back pain.  All other systems reviewed and are negative.  Physical Exam   Blood pressure 115/73, pulse 66, temperature 99.1 F (37.3 C), temperature source Oral, resp. rate 19, height 5\' 5"   (1.651 m), weight 71.5 kg, last menstrual period 10/04/2021, SpO2 97 %.  Physical Exam Vitals and nursing note reviewed. Exam conducted with a chaperone present.  Constitutional:      Appearance: She is well-developed.  Cardiovascular:     Rate and Rhythm: Normal rate and regular rhythm.     Heart sounds: Normal heart sounds. No murmur heard. Pulmonary:     Effort: Pulmonary effort is normal.     Breath sounds: Normal breath sounds.  Abdominal:     General: Abdomen is flat. Bowel sounds are normal.     Palpations: Abdomen is soft.     Tenderness: There is no abdominal tenderness.  Genitourinary:    Rectum: Normal. No mass, tenderness, external hemorrhoid or internal hemorrhoid. Normal anal tone.  Skin:    General: Skin is warm.     Capillary Refill: Capillary refill takes less than 2 seconds.  Neurological:     Mental Status: She is alert and oriented to person, place, and time.  Psychiatric:        Mood and Affect: Mood normal.        Behavior: Behavior normal.    MAU Course  Procedures  MDM  --Discussed ongoing constipation, recurrent use of Miralax and normal diet. Advised bland snacks, consider bowel rest of stomach becomes upset. Can pursue referral to GI PRN  --THC use one month ago. Patient agreeable to repeat treatment for Cannabis Hyperemesis  Orders Placed This Encounter  Procedures   Wet prep, genital   Urinalysis, Routine w reflex microscopic Urine, Clean Catch   Discharge patient   Meds ordered this encounter  Medications   lactated ringers bolus 1,000 mL   haloperidol lactate (HALDOL) injection 2 mg   diphenhydrAMINE (BENADRYL) injection 25 mg   acetaminophen (TYLENOL) tablet 1,000 mg   docusate sodium (COLACE) 100 MG capsule    Sig: Take 1 capsule (100 mg total) by mouth 2 (two) times daily as needed.    Dispense:  30 capsule    Refill:  2    Order Specific Question:   Supervising Provider    Answer:   Reva Bores [[id number]]   Assessment and Plan   --21 y.o. G3P0020 at [redacted]w[redacted]d  --FHT 140 and very active movement on BSUS  --Pain resolved with tx in MAU --Normal rectal exam --Consider bowel rest then reintroduce slow bland snacking --Patient without complaints at time of discharge --Discharge home in stable condition  F/U: Patient is scheduled for New OB at [facility] but states she would like to change to a location in a different city  [place]vert [place], [place] 12/28/2021, 8:08 PM

## 2021-12-28 NOTE — Telephone Encounter (Signed)
Pt called concerning report of bleeding from "bottom" every time she goes to sit on the toilet. MAU visit 12/27/21 PM for constipation, left side abdominal pain. Miralax and enema tried with some results. Pt had nausea and abdominal pain and was treated with nausea meds and IVF. Discharged with Dr. [place] Blas clean out protocol.  Pt and sister express concern that [place] was not performed at visit. Pt reports continued abdominal pain. Pt is planning to return to MAU and is requesting [place]. Advised that early US showed viable single IUP and that [place] typically only repeated for vaginal bleeding or other concern for IUP. Dr. Adrian Blackwater updated on pt's concerns. No further actions indicated at this time.

## 2021-12-31 ENCOUNTER — Inpatient Hospital Stay (HOSPITAL_COMMUNITY)
Admission: AD | Admit: 2021-12-31 | Discharge: 2021-12-31 | Disposition: A | Discharge status: Home / Self Care | Payer: BC Managed Care – PPO | Attending: Emergency Medicine | Admitting: Emergency Medicine

## 2021-12-31 ENCOUNTER — Encounter (HOSPITAL_COMMUNITY): Payer: Self-pay | Admitting: Obstetrics & Gynecology

## 2021-12-31 ENCOUNTER — Inpatient Hospital Stay (HOSPITAL_COMMUNITY): Payer: BC Managed Care – PPO

## 2021-12-31 DIAGNOSIS — Z79899 Other long term (current) drug therapy: Secondary | ICD-10-CM | POA: Insufficient documentation

## 2021-12-31 DIAGNOSIS — R1032 Left lower quadrant pain: Secondary | ICD-10-CM | POA: Insufficient documentation

## 2021-12-31 DIAGNOSIS — O26891 Other specified pregnancy related conditions, first trimester: Secondary | ICD-10-CM | POA: Insufficient documentation

## 2021-12-31 DIAGNOSIS — O209 Hemorrhage in early pregnancy, unspecified: Secondary | ICD-10-CM | POA: Diagnosis not present

## 2021-12-31 DIAGNOSIS — K625 Hemorrhage of anus and rectum: Secondary | ICD-10-CM | POA: Diagnosis not present

## 2021-12-31 DIAGNOSIS — Z3A12 12 weeks gestation of pregnancy: Secondary | ICD-10-CM | POA: Diagnosis not present

## 2021-12-31 DIAGNOSIS — R109 Unspecified abdominal pain: Secondary | ICD-10-CM | POA: Diagnosis not present

## 2021-12-31 LAB — CBC
HCT: 33.5 % — ABNORMAL LOW (ref 36.0–46.0)
Hemoglobin: 11.1 g/dL — ABNORMAL LOW (ref 12.0–15.0)
MCH: 28.2 pg (ref 26.0–34.0)
MCHC: 33.1 g/dL (ref 30.0–36.0)
MCV: 85 fL (ref 80.0–100.0)
Platelets: 219 10*3/uL (ref 150–400)
RBC: 3.94 MIL/uL (ref 3.87–5.11)
RDW: 11.2 % — ABNORMAL LOW (ref 11.5–15.5)
WBC: 6 10*3/uL (ref 4.0–10.5)
nRBC: 0 % (ref 0.0–0.2)

## 2021-12-31 LAB — COMPREHENSIVE METABOLIC PANEL
ALT: 11 U/L (ref 0–44)
AST: 13 U/L — ABNORMAL LOW (ref 15–41)
Albumin: 3.8 g/dL (ref 3.5–5.0)
Alkaline Phosphatase: 51 U/L (ref 38–126)
Anion gap: 8 (ref 5–15)
BUN: <5 mg/dL — ABNORMAL LOW (ref 6–20)
CO2: 22 mmol/L (ref 22–32)
Calcium: 9.5 mg/dL (ref 8.9–10.3)
Chloride: 106 mmol/L (ref 98–111)
Creatinine, Ser: 0.6 mg/dL (ref 0.44–1.00)
GFR, Estimated: >60 mL/min (ref >60)
Glucose, Bld: 95 mg/dL (ref 70–99)
Potassium: 3.9 mmol/L (ref 3.5–5.1)
Sodium: 136 mmol/L (ref 135–145)
Total Bilirubin: 0.6 mg/dL (ref 0.3–1.2)
Total Protein: 6.9 g/dL (ref 6.5–8.1)

## 2021-12-31 LAB — GC/CHLAMYDIA PROBE AMP (~~LOC~~) NOT AT ARMC
Chlamydia: NEGATIVE
Comment: NEGATIVE
Comment: NORMAL
Neisseria Gonorrhea: NEGATIVE

## 2021-12-31 LAB — I-STAT BETA HCG BLOOD, ED (MC, WL, AP ONLY): I-stat hCG, quantitative: >2000 m[IU]/mL — ABNORMAL HIGH (ref <5)

## 2021-12-31 MED ORDER — SODIUM CHLORIDE 0.9 % IV BOLUS
1000.0000 mL | Freq: Once | INTRAVENOUS | Status: AC
  Administered 2021-12-31: 1000 mL via INTRAVENOUS

## 2021-12-31 MED ORDER — ACETAMINOPHEN 500 MG PO TABS
1000.0000 mg | ORAL_TABLET | Freq: Once | ORAL | Status: AC
  Administered 2021-12-31: 1000 mg via ORAL
  Filled 2021-12-31: qty 2

## 2021-12-31 MED ORDER — DOXYLAMINE-PYRIDOXINE 10-10 MG PO TBEC
1.0000 | DELAYED_RELEASE_TABLET | Freq: Two times a day (BID) | ORAL | 0 refills | Status: DC | PRN
Start: 2021-12-31 — End: 2022-01-24

## 2021-12-31 MED ORDER — ONDANSETRON HCL 4 MG/2ML IJ SOLN
4.0000 mg | Freq: Once | INTRAMUSCULAR | Status: AC
Start: 2021-12-31 — End: 2021-12-31
  Administered 2021-12-31: 4 mg via INTRAVENOUS
  Filled 2021-12-31: qty 2

## 2021-12-31 NOTE — MAU Provider Note (Signed)
S Emily Foster is a 21 y.o. G3P0020 patient who presents to MAU today with complaints of vomiting dark blood, and 2 days of bright red blood coming from the rectum.    O BP 121/73 (BP Location: Right Arm)   Pulse 88   Temp 98.9 F (37.2 C) (Oral)   Resp 18   Ht 5\' 5"  (1.651 m)   Wt 70.9 kg   LMP 10/04/2021   SpO2 99%   BMI 26.03 kg/m  Physical Exam Constitutional:      Appearance: Normal appearance.  HENT:     Head: Normocephalic.  Neurological:     Mental Status: She is alert and oriented to person, place, and time.  Psychiatric:        Behavior: Behavior normal.    A Medical screening exam complete  P  Discussed patient with [facility] ED provider, patient to be evaluated there for rectal bleeding. + fetal heart tones via doppler Patient to transfer via wheelchair.   [facility], NP 12/31/2021 2:43 AM

## 2021-12-31 NOTE — ED Notes (Signed)
Pt transported to Ultrasound.  

## 2021-12-31 NOTE — ED Notes (Signed)
Patient transported to Ultrasound 

## 2021-12-31 NOTE — ED Provider Notes (Signed)
[facility]-EMERGENCY DEPT [facility] Emergency Department Provider Note MRN:  [id number]  Arrival date & time: 12/31/21     Chief Complaint   GI Bleeding   History of Present Illness   Emily Foster is a 21 y.o. year-old female with no pertinent past medical history presenting to the ED with chief complaint of GI bleeding.  Patient sent here from the MAU for GI bleeding.  Occasional bright red blood per rectum over the past few days.  Also thinks she threw up some blood.  Endorsing left lower quadrant pain.  Back pain.  Persistent nausea and frequent emesis during this first trimester pregnancy.  Review of Systems  A thorough review of systems was obtained and all systems are negative except as noted in the HPI and PMH.   Patient's Health History    Past Medical History:  Diagnosis Date   Allergy    Asthma    UTI (urinary tract infection)     Past Surgical History:  Procedure Laterality Date   TOE SURGERY      Family History  Problem Relation Age of Onset   Diabetes Mother    Diabetes Paternal Grandmother    Diabetes Paternal Grandfather     Social History   Socioeconomic History   Marital status: Single    Spouse name: Not on file   Number of children: Not on file   Years of education: Not on file   Highest education level: Not on file  Occupational History   Not on file  Tobacco Use   Smoking status: Never   Smokeless tobacco: Never  Vaping Use   Vaping Use: Former  Substance and Sexual Activity   Alcohol use: No   Drug use: No   Sexual activity: Not Currently    Birth control/protection: None  Other Topics Concern   Not on file  Social History Narrative   Planning to go community college   Lives with boyfriend   Social Determinants of Health   Financial Resource Strain: Not on file  Food Insecurity: Not on file  Transportation Needs: Not on file  Physical Activity: Not on file  Stress: Not on file  Social Connections: Not on file  Intimate  Partner Violence: Not on file     Physical Exam   Vitals:   12/31/21 0445 12/31/21 0530  BP: 119/66 115/70  Pulse: 67 71  Resp: (!) 22 (!) 22  Temp:    SpO2: 100% 100%    CONSTITUTIONAL: Well-appearing, NAD NEURO/PSYCH:  Alert and oriented x 3, no focal deficits EYES:  eyes equal and reactive ENT/NECK:  no LAD, no JVD CARDIO: Regular rate, well-perfused, normal S1 and S2 PULM:  CTAB no wheezing or rhonchi GI/GU:  non-distended, non-tender MSK/SPINE:  No gross deformities, no edema SKIN:  no rash, atraumatic   *Additional and/or pertinent findings included in MDM below  Diagnostic and Interventional Summary    EKG Interpretation  Date/Time:    Ventricular Rate:    PR Interval:    QRS Duration:   QT Interval:    QTC Calculation:   R Axis:     Text Interpretation:         Labs Reviewed  COMPREHENSIVE METABOLIC PANEL - Abnormal; Notable for the following components:      Result Value   BUN <5 (*)    AST 13 (*)    All other components within normal limits  CBC - Abnormal; Notable for the following components:   Hemoglobin 11.1 (*)  HCT 33.5 (*)    RDW 11.2 (*)    All other components within normal limits  I-STAT BETA HCG BLOOD, ED ([facility], [facility], [facility] ONLY) - Abnormal; Notable for the following components:   I-stat hCG, quantitative >2,000.0 (*)    All other components within normal limits  URINALYSIS, ROUTINE W REFLEX MICROSCOPIC  POC OCCULT BLOOD, ED  TYPE AND SCREEN    US OB Comp Less 14 Wks  Final Result      Medications  sodium chloride 0.9 % bolus 1,000 mL (0 mLs Intravenous Stopped 12/31/21 0531)  ondansetron (ZOFRAN) injection 4 mg (4 mg Intravenous Given 12/31/21 0424)  acetaminophen (TYLENOL) tablet 1,000 mg (1,000 mg Oral Given 12/31/21 0538)     Procedures  /  Critical Care Procedures  ED Course and Medical Decision Making  Initial Impression and Ddx Patient is well-appearing with normal vital signs and a largely benign abdomen.  She has pictures  of the bleeding and it is overall very light and mild.  Overall highly doubt any emergent GI bleed.  Potentially Mallory-Weiss tear from all the vomiting.  May be hemorrhoidal bleeding causing the small amount of bright red blood per rectum.  Unclear cause of left lower quadrant abdominal pain, will repeat ultrasound.  Anticipating discharge  Past medical/surgical history that increases complexity of ED encounter: Pregnant  Interpretation of Diagnostics I personally reviewed the laboratory assessment and my interpretation is as follows: No significant blood count or electrolyte disturbance, no significant drop in hemoglobin  Ultrasound is reassuring.  Patient Reassessment and Ultimate Disposition/Management     Patient with continued normal vital signs, resting comfortably, no bleeding here in the emergency department, highly doubt significant GI bleed, appropriate for discharge.  Patient management required discussion with the following services or consulting groups:  None  Complexity of Problems Addressed Acute illness or injury that poses threat of life of bodily function  Additional Data Reviewed and Analyzed Further history obtained from: None  Additional Factors Impacting ED Encounter Risk Consideration of hospitalization  Elmer Sow. Pilar Plate, MD [facility] Emergency Medicine [facility] [email or web address]  Final Clinical Impressions(s) / ED Diagnoses     ICD-10-CM   1. Rectal bleeding  K62.5     2. [redacted] weeks gestation of pregnancy  Z3A.12       ED Discharge Orders          Ordered    Doxylamine-Pyridoxine 10-10 MG TBEC  2 times daily PRN        12/31/21 0648             Discharge Instructions Discussed with and Provided to Patient:     Discharge Instructions      You were evaluated in the Emergency Department and after careful evaluation, we did not find any emergent condition requiring admission or further testing in the hospital.  Your  exam/testing today was overall reassuring.  Recommend using the Diclegis medicine to control your nausea and vomiting.  Increase the amount of fiber in your diet.  Follow-up with the OB/GYN doctors for continued care.  Please return to the Emergency Department if you experience any worsening of your condition.  Thank you for allowing [place] to be a part of your care.        Sabas Sous, MD 12/31/21 [phone]

## 2021-12-31 NOTE — MAU Note (Signed)
..  Emily Foster is a 21 y.o. at [redacted]w[redacted]d here in MAU reporting: Has been "pooping blood" bright red for 2 days. Has also been vomiting blood. Took zofran and put another patch on for her vomiting but it has not helped. Has vomited more than 10 times in the past 24 hours.  Patient has pictures on her phone.  Also has abdominal pain that has been worsening since when she came to MAU for constipation. The pain is stabbing on her lower left abdomen and is constant. Has not taken anything for the pain.  Denies vaginal bleeding or abnormal vaginal discharge.   Pain score: 10/10 Vitals:   12/31/21 0226  BP: 121/73  Pulse: 88  Resp: 18  Temp: 98.9 F (37.2 C)  SpO2: 99%     FHT: doppler 156 Lab orders placed from triage: UA

## 2021-12-31 NOTE — ED Triage Notes (Signed)
Pt comes from MAU for rectal bleeding and hematuresis that has been going on for the past few days, bright red blood

## 2021-12-31 NOTE — Discharge Instructions (Addendum)
You were evaluated in the Emergency Department and after careful evaluation, we did not find any emergent condition requiring admission or further testing in the hospital.  Your exam/testing today was overall reassuring.  Recommend using the Diclegis medicine to control your nausea and vomiting.  Increase the amount of fiber in your diet.  Follow-up with the OB/GYN doctors for continued care.  Please return to the Emergency Department if you experience any worsening of your condition.  Thank you for allowing [place] to be a part of your care.

## 2022-01-01 LAB — TYPE AND SCREEN
ABO/RH(D): AB POS
Antibody Screen: NEGATIVE

## 2022-01-12 ENCOUNTER — Other Ambulatory Visit: Payer: Self-pay

## 2022-01-12 ENCOUNTER — Encounter (HOSPITAL_COMMUNITY): Payer: Self-pay | Admitting: Obstetrics and Gynecology

## 2022-01-12 ENCOUNTER — Telehealth: Payer: BC Managed Care – PPO | Admitting: Family Medicine

## 2022-01-12 ENCOUNTER — Inpatient Hospital Stay (HOSPITAL_COMMUNITY)
Admission: AD | Admit: 2022-01-12 | Discharge: 2022-01-12 | Disposition: A | Discharge status: Home / Self Care | Payer: BC Managed Care – PPO | Attending: Obstetrics and Gynecology | Admitting: Obstetrics and Gynecology

## 2022-01-12 DIAGNOSIS — Z3A14 14 weeks gestation of pregnancy: Secondary | ICD-10-CM | POA: Diagnosis not present

## 2022-01-12 DIAGNOSIS — O23592 Infection of other part of genital tract in pregnancy, second trimester: Secondary | ICD-10-CM | POA: Diagnosis not present

## 2022-01-12 DIAGNOSIS — O26892 Other specified pregnancy related conditions, second trimester: Secondary | ICD-10-CM | POA: Insufficient documentation

## 2022-01-12 DIAGNOSIS — L0291 Cutaneous abscess, unspecified: Secondary | ICD-10-CM

## 2022-01-12 DIAGNOSIS — N764 Abscess of vulva: Secondary | ICD-10-CM

## 2022-01-12 DIAGNOSIS — Z3490 Encounter for supervision of normal pregnancy, unspecified, unspecified trimester: Secondary | ICD-10-CM

## 2022-01-12 MED ORDER — ACETAMINOPHEN-CAFFEINE 500-65 MG PO TABS
2.0000 | ORAL_TABLET | Freq: Once | ORAL | Status: AC
  Administered 2022-01-12: 2 via ORAL
  Filled 2022-01-12 (×2): qty 2

## 2022-01-12 MED ORDER — ASPIRIN-ACETAMINOPHEN-CAFFEINE 250-250-65 MG PO TABS: 2.0000 | ORAL_TABLET | Freq: Once | ORAL | Status: DC

## 2022-01-12 MED ORDER — SULFAMETHOXAZOLE-TRIMETHOPRIM 800-160 MG PO TABS
1.0000 | ORAL_TABLET | Freq: Two times a day (BID) | ORAL | 0 refills | Status: AC
Start: 2022-01-12 — End: 2022-01-19

## 2022-01-12 MED ORDER — LIDOCAINE HCL (PF) 1 % IJ SOLN
5.0000 mL | Freq: Once | INTRAMUSCULAR | Status: AC
  Administered 2022-01-12: 5 mL via INTRADERMAL
  Filled 2022-01-12: qty 5

## 2022-01-12 MED ORDER — LIDOCAINE HCL 2 % EX GEL: 1.0000 | CUTANEOUS | 1 refills | Status: DC | PRN

## 2022-01-12 NOTE — Procedures (Addendum)
I&D NOTE The indications for I&D were reviewed - abscess - it was indurated and acutely tender to the touch. Size was about 2-4 cm.  Risks include pain, bleeding, infection, scarring and need for additional procedures were discussed. The patient stated understanding and agreed to undergo procedure today. Consent was signed, time out performed.   Abscess prior to I&D   The patient's skin over the abscess was prepped with Betadine.  Skin was numbed with 2 cc of lidocaine. A stab incision was made with an 11 blade scalpel that was about 3 mm.  Culture was collected. The pus was drained.  Soft end of the qtip was used to break up loculations. The patient tolerated the procedure well and felt much better following drainage.    Post-procedure instructions and ABX were given to the patient. The patient is to call with heavy bleeding, fever greater than 100.4, foul smelling vaginal discharge or other concerns.   She will be sent home on antibiotics and reviewed cleansers over soaps for vulva.  Milas Hock, MD Attending Obstetrician & Gynecologist, [facility] for [facility], [facility]

## 2022-01-12 NOTE — MAU Provider Note (Signed)
History     CSN: [id number]  Arrival date and time: 01/12/22 1738   Event Date/Time   First Provider Initiated Contact with Patient 01/12/22 1834      Chief Complaint  Patient presents with   Boil   HPI Emily Foster is a 21 y.o. G3P0020 at 24w2do wh presents to MAU with complaint of "boil" on her labia. This is a recurrent problem which patient typically manages with topical Lidocaine. Her current boil has been in place for about two weeks but feels as if it has become bigger in the past 4 days. She is unable to sit evenly on her bottom due to pain. She denies pregnancy-related concerns including abdominal pain, vaginal bleeding, dysuria, fever or recent illness.  OB History     Gravida  3   Para  0   Term  0   Preterm  0   AB  2   Living  0      SAB  2   IAB  0   Ectopic  0   Multiple  0   Live Births  0           Past Medical History:  Diagnosis Date   Allergy    Asthma    UTI (urinary tract infection)     Past Surgical History:  Procedure Laterality Date   TOE SURGERY      Family History  Problem Relation Age of Onset   Diabetes Mother    Diabetes Paternal Grandmother    Diabetes Paternal Grandfather     Social History   Tobacco Use   Smoking status: Never   Smokeless tobacco: Never  Vaping Use   Vaping Use: Former  Substance Use Topics   Alcohol use: No   Drug use: No    Allergies:  Allergies  Allergen Reactions   Soy Allergy Itching, Swelling and Rash    Medications Prior to Admission  Medication Sig Dispense Refill Last Dose   acetaminophen (TYLENOL) 500 MG tablet Take 1,000 mg by mouth every 6 (six) hours as needed.   01/11/2022   Cyanocobalamin (VITAMIN B12 PO) Take 1 each by mouth daily in the afternoon.   01/12/2022   docusate sodium (COLACE) 100 MG capsule Take 1 capsule (100 mg total) by mouth 2 (two) times daily as needed. 30 capsule 2 Past Week   doxylamine, Sleep, (UNISOM) 25 MG tablet Take 1 tablet (25 mg total)  by mouth every 8 (eight) hours as needed. 30 tablet 0 Past Week   Doxylamine-Pyridoxine 10-10 MG TBEC Take 1 tablet by mouth 2 (two) times daily as needed. 60 tablet 0 Past Week   Fluticasone Propionate, Inhal, 100 MCG/BLIST AEPB Inhale 1 puff into the lungs 2 (two) times daily. 28 each 1 Past Week   glycopyrrolate (ROBINUL) 1 MG tablet Take 1 tablet (1 mg total) by mouth 3 (three) times daily. 90 tablet 0 Past Week   lidocaine (XYLOCAINE) 2 % jelly Apply 1 application  topically as needed. 30 mL 0 01/12/2022   ondansetron (ZOFRAN) 4 MG tablet Take 1 tablet (4 mg total) by mouth every 8 (eight) hours as needed for nausea or vomiting. 20 tablet 2 Past Week   polyethylene glycol (MIRALAX) 17 g packet Take 17 g by mouth daily. 14 each 0 Past Week   Prenatal Vit-Fe Fumarate-FA (PREPLUS) 27-1 MG TABS Take 1 tablet by mouth daily. 30 tablet 13 01/12/2022   promethazine (PHENERGAN) 12.5 MG tablet Take 1 tablet (12.5 mg total)  by mouth every 6 (six) hours as needed for nausea or vomiting. 30 tablet 3 Past Week   pyridOXINE (VITAMIN B-6) 25 MG tablet Take 1 tablet (25 mg total) by mouth every 8 (eight) hours. 30 tablet 0 01/12/2022   scopolamine (TRANSDERM-SCOP) 1 MG/3DAYS Place 1 patch (1.5 mg total) onto the skin every 3 (three) days. 10 patch 12 Past Week   albuterol (VENTOLIN HFA) 108 (90 Base) MCG/ACT inhaler TAKE 2 PUFFS BY MOUTH EVERY 6 HOURS AS NEEDED FOR WHEEZE OR SHORTNESS OF BREATH 6.7 each 2 More than a month    Review of Systems Physical Exam   Blood pressure 112/72, pulse 99, temperature 97.9 F (36.6 C), temperature source Oral, resp. rate 17, height 5\' 5"  (1.651 m), weight 70 kg, last menstrual period 10/04/2021, SpO2 100 %.  Physical Exam  MAU Course  Procedures  MDM ***  Assessment and Plan  ***  12/04/2021 01/12/2022, 7:00 PM

## 2022-01-12 NOTE — MAU Note (Signed)
I&D performed via Dr.Duncan. Pt consented and time out was confirmed prior to beginning. See MD note. Clean gauze and peri pad applied to site. Pt denies needs or concerns at this time.

## 2022-01-12 NOTE — Discharge Instructions (Signed)

## 2022-01-12 NOTE — MAU Note (Signed)
Emily Foster is a 21 y.o. at [redacted]w[redacted]d here in MAU reporting: she has a boil located on her right labia.  Reports has had boil a couple weeks but it's gotten bigger the last 4-5 days.  Denies VB or LOF.  Last took Tylenol 500 mg yesterday   Onset of complaint: 4-5 days Pain score: 10/10 Vitals:   01/12/22 1819  BP: 109/67  Pulse: (!) 102  Resp: 20  Temp: 98.3 F (36.8 C)  SpO2: 100%     FHT:161 bpm Lab orders placed from triage:

## 2022-01-12 NOTE — Progress Notes (Signed)
Because [place],  I feel your condition warrants further evaluation and I recommend that you be seen in a face to face visit with your gynecologist or at one of our [facility] clinics.  Please contact your OB for further instructions. Thank you   NOTE: There will be NO CHARGE for this eVisit   If you are having a true medical emergency please call 911.    *Center for [facility]             [address], [place], [place] [address] [phone] (*Take patients with no insurance)  *Center for [employer] at [facility] [address] [place], [place],  [place]  [address] [phone] (*Take patients with no insurance)  Center for [employer] at [employer]                                                             [address], [place], [place], [address] [phone]  Center for [facility] at [facility] [address], [address], [place], [place], [address] [phone]  Center for [facility] at [facility] [address], [place], [place], [address] [phone]  Center for [facility] at [facility]                                 [address] [place], [place], [place], [address] [phone]  Center for [facility] at [facility]                                    [address], [place], [place], [address] [phone]  Center for [facility] at [facility] [address], [place], [place], [address]                              [facility] [address], [address], [place], [place], [address] [phone]  Your MyChart E-visit questionnaire answers were reviewed by a board certified advanced clinical practitioner to complete your personal care plan based on your specific symptoms.  Thank you for using e-Visits.

## 2022-01-12 NOTE — MAU Note (Signed)
Pt states headache has resolved following po medication

## 2022-01-17 LAB — AEROBIC/ANAEROBIC CULTURE W GRAM STAIN (SURGICAL/DEEP WOUND)

## 2022-01-21 ENCOUNTER — Telehealth: Payer: BC Managed Care – PPO | Admitting: Physician Assistant

## 2022-01-21 DIAGNOSIS — B3731 Acute candidiasis of vulva and vagina: Secondary | ICD-10-CM

## 2022-01-21 DIAGNOSIS — Z3491 Encounter for supervision of normal pregnancy, unspecified, first trimester: Secondary | ICD-10-CM | POA: Diagnosis not present

## 2022-01-22 ENCOUNTER — Inpatient Hospital Stay (HOSPITAL_COMMUNITY)
Admission: AD | Admit: 2022-01-22 | Discharge: 2022-01-22 | Disposition: A | Discharge status: Home / Self Care | Payer: BC Managed Care – PPO | Attending: Obstetrics and Gynecology | Admitting: Obstetrics and Gynecology

## 2022-01-22 DIAGNOSIS — O26892 Other specified pregnancy related conditions, second trimester: Secondary | ICD-10-CM | POA: Diagnosis not present

## 2022-01-22 DIAGNOSIS — B3731 Acute candidiasis of vulva and vagina: Secondary | ICD-10-CM | POA: Insufficient documentation

## 2022-01-22 DIAGNOSIS — O98812 Other maternal infectious and parasitic diseases complicating pregnancy, second trimester: Secondary | ICD-10-CM | POA: Diagnosis not present

## 2022-01-22 DIAGNOSIS — Z3A15 15 weeks gestation of pregnancy: Secondary | ICD-10-CM | POA: Diagnosis not present

## 2022-01-22 LAB — URINALYSIS, ROUTINE W REFLEX MICROSCOPIC
Bilirubin Urine: NEGATIVE
Glucose, UA: NEGATIVE mg/dL
Hgb urine dipstick: NEGATIVE
Ketones, ur: NEGATIVE mg/dL
Nitrite: NEGATIVE
Protein, ur: 30 mg/dL — AB
Specific Gravity, Urine: 1.025 (ref 1.005–1.030)
pH: 5 (ref 5.0–8.0)

## 2022-01-22 LAB — WET PREP, GENITAL
Clue Cells Wet Prep HPF POC: NONE SEEN
Sperm: NONE SEEN
Trich, Wet Prep: NONE SEEN
WBC, Wet Prep HPF POC: >=10 — AB (ref <10)

## 2022-01-22 MED ORDER — TERCONAZOLE 0.4 % VA CREA: 1.0000 | TOPICAL_CREAM | Freq: Every day | VAGINAL | 0 refills | Status: DC

## 2022-01-22 NOTE — MAU Provider Note (Signed)
History     CSN: [id number]  Arrival date and time: 01/22/22 9563   Event Date/Time   First Provider Initiated Contact with Patient 01/22/22 [phone]      Chief Complaint  Patient presents with   Burning with Urination   21 y.o. G3P0020 @15 .5 wks presenting with vaginal burning. Pain started 2 days ago. Reports vaginal burning when she urinates. Reports thick white curdy discharge with itching. Denies malodor. Denies VB. Denies abd pain. She thinks she may have a yeast infection.      OB History     Gravida  3   Para  0   Term  0   Preterm  0   AB  2   Living  0      SAB  2   IAB  0   Ectopic  0   Multiple  0   Live Births  0           Past Medical History:  Diagnosis Date   Allergy    Asthma    UTI (urinary tract infection)     Past Surgical History:  Procedure Laterality Date   TOE SURGERY      Family History  Problem Relation Age of Onset   Diabetes Mother    Diabetes Paternal Grandmother    Diabetes Paternal Grandfather     Social History   Tobacco Use   Smoking status: Never   Smokeless tobacco: Never  Vaping Use   Vaping Use: Former  Substance Use Topics   Alcohol use: No   Drug use: No    Allergies:  Allergies  Allergen Reactions   Soy Allergy Itching, Swelling and Rash    No medications prior to admission.    Review of Systems  Gastrointestinal:  Negative for abdominal pain.  Genitourinary:  Positive for vaginal discharge and vaginal pain. Negative for dysuria, frequency, hematuria, urgency and vaginal bleeding.   Physical Exam   Blood pressure 117/73, pulse 90, temperature 98.1 F (36.7 C), temperature source Oral, resp. rate 14, last menstrual period 10/04/2021, SpO2 100 %.  Physical Exam Vitals and nursing note reviewed.  Constitutional:      General: She is not in acute distress.    Appearance: Normal appearance.  HENT:     Head: Normocephalic and atraumatic.  Cardiovascular:     Rate and Rhythm: Normal  rate.  Pulmonary:     Effort: Pulmonary effort is normal. No respiratory distress.  Musculoskeletal:        General: Normal range of motion.     Cervical back: Normal range of motion.  Neurological:     General: No focal deficit present.     Mental Status: She is alert and oriented to person, place, and time.  Psychiatric:        Mood and Affect: Mood normal.        Behavior: Behavior normal.   FHT 150  Results for orders placed or performed during the hospital encounter of 01/22/22 (from the past 24 hour(s))  Urinalysis, Routine w reflex microscopic Urine, Clean Catch     Status: Abnormal   Collection Time: 01/22/22  7:42 AM  Result Value Ref Range   Color, Urine AMBER (A) YELLOW   APPearance CLOUDY (A) CLEAR   Specific Gravity, Urine 1.025 1.005 - 1.030   pH 5.0 5.0 - 8.0   Glucose, UA NEGATIVE NEGATIVE mg/dL   Hgb urine dipstick NEGATIVE NEGATIVE   Bilirubin Urine NEGATIVE NEGATIVE   Ketones, ur  NEGATIVE NEGATIVE mg/dL   Protein, ur 30 (A) NEGATIVE mg/dL   Nitrite NEGATIVE NEGATIVE   Leukocytes,Ua LARGE (A) NEGATIVE   RBC / HPF 11-20 0 - 5 RBC/hpf   WBC, UA 21-50 0 - 5 WBC/hpf   Bacteria, UA RARE (A) NONE SEEN   Squamous Epithelial / LPF 6-10 0 - 5   Mucus PRESENT    Ca Oxalate Crys, UA PRESENT   Wet prep, genital     Status: Abnormal   Collection Time: 01/22/22  7:50 AM   Specimen: PATH Cytology Cervicovaginal Ancillary Only  Result Value Ref Range   Yeast Wet Prep HPF POC PRESENT (A) NONE SEEN   Trich, Wet Prep NONE SEEN NONE SEEN   Clue Cells Wet Prep HPF POC NONE SEEN NONE SEEN   WBC, Wet Prep HPF POC >=10 (A) <10   Sperm NONE SEEN    MAU Course  Procedures  MDM Labs ordered and reviewed. Will treat for yeast. No urinary sx but urine culture ordered. Stable for discharge home.  Assessment and Plan   1. [redacted] weeks gestation of pregnancy   2. Yeast vaginitis    Discharge home Follow up at [facility] as scheduled Return precautions Rx Terazol  Allergies as  of 01/22/2022       Reactions   Soy Allergy Itching, Swelling, Rash        Medication List     TAKE these medications    acetaminophen 500 MG tablet Commonly known as: TYLENOL Take 1,000 mg by mouth every 6 (six) hours as needed.   albuterol 108 (90 Base) MCG/ACT inhaler Commonly known as: VENTOLIN HFA TAKE 2 PUFFS BY MOUTH EVERY 6 HOURS AS NEEDED FOR WHEEZE OR SHORTNESS OF BREATH   docusate sodium 100 MG capsule Commonly known as: COLACE Take 1 capsule (100 mg total) by mouth 2 (two) times daily as needed.   doxylamine (Sleep) 25 MG tablet Commonly known as: UNISOM Take 1 tablet (25 mg total) by mouth every 8 (eight) hours as needed.   Doxylamine-Pyridoxine 10-10 MG Tbec Take 1 tablet by mouth 2 (two) times daily as needed.   Fluticasone Propionate (Inhal) 100 MCG/BLIST Aepb Inhale 1 puff into the lungs 2 (two) times daily.   glycopyrrolate 1 MG tablet Commonly known as: Robinul Take 1 tablet (1 mg total) by mouth 3 (three) times daily.   lidocaine 2 % jelly Commonly known as: XYLOCAINE Apply 1 Application topically as needed.   ondansetron 4 MG tablet Commonly known as: Zofran Take 1 tablet (4 mg total) by mouth every 8 (eight) hours as needed for nausea or vomiting.   polyethylene glycol 17 g packet Commonly known as: MiraLax Take 17 g by mouth daily.   PrePLUS 27-1 MG Tabs Take 1 tablet by mouth daily.   promethazine 12.5 MG tablet Commonly known as: PHENERGAN Take 1 tablet (12.5 mg total) by mouth every 6 (six) hours as needed for nausea or vomiting.   pyridOXINE 25 MG tablet Commonly known as: VITAMIN B-6 Take 1 tablet (25 mg total) by mouth every 8 (eight) hours.   scopolamine 1 MG/3DAYS Commonly known as: TRANSDERM-SCOP Place 1 patch (1.5 mg total) onto the skin every 3 (three) days.   terconazole 0.4 % vaginal cream Commonly known as: TERAZOL 7 Place 1 applicator vaginally at bedtime.   VITAMIN B12 PO Take 1 each by mouth daily in the  afternoon.        Donette Larry, CNM 01/22/2022, 9:00 AM

## 2022-01-22 NOTE — Progress Notes (Signed)
I have spent 5 minutes in review of e-visit questionnaire, review and updating patient chart, medical decision making and response to patient.   Courtni Balash Cody Drelyn Pistilli, PA-C    

## 2022-01-22 NOTE — MAU Note (Addendum)
...  Emily Foster is a 21 y.o. at [redacted]w[redacted]d here in MAU reporting: Burning with urination that is "internal and external" since this past Sunday, 7/23. She reports she has a frequent urge to urinate but has been holding her urine due to the burning. Last IC yesterday. Endorses vaginal itching that began two days ago as well. She reports all weekend she used a Dr. Celestia Khat soap that she left at someone's house one year ago. Denies VB or LOF.    Onset of complaint: 01/20/2022 Pain score: Denies pain outside of the burning with urination. 8/10.  FHT: 150 doppler Lab orders placed from triage: UA

## 2022-01-22 NOTE — Progress Notes (Signed)

## 2022-01-23 LAB — CULTURE, OB URINE: Culture: 10000 — AB

## 2022-01-23 LAB — GC/CHLAMYDIA PROBE AMP (~~LOC~~) NOT AT ARMC
Chlamydia: NEGATIVE
Comment: NEGATIVE
Comment: NORMAL
Neisseria Gonorrhea: NEGATIVE

## 2022-01-24 ENCOUNTER — Ambulatory Visit (INDEPENDENT_AMBULATORY_CARE_PROVIDER_SITE_OTHER): Payer: BC Managed Care – PPO | Admitting: Family Medicine

## 2022-01-24 ENCOUNTER — Encounter: Payer: Self-pay | Admitting: General Practice

## 2022-01-24 ENCOUNTER — Other Ambulatory Visit (HOSPITAL_COMMUNITY)
Admission: RE | Admit: 2022-01-24 | Discharge: 2022-01-24 | Disposition: A | Discharge status: Home / Self Care | Payer: BC Managed Care – PPO | Source: Ambulatory Visit | Attending: Family Medicine | Admitting: Family Medicine

## 2022-01-24 ENCOUNTER — Encounter: Payer: Self-pay | Admitting: Family Medicine

## 2022-01-24 DIAGNOSIS — R8271 Bacteriuria: Secondary | ICD-10-CM | POA: Diagnosis not present

## 2022-01-24 DIAGNOSIS — Z3482 Encounter for supervision of other normal pregnancy, second trimester: Secondary | ICD-10-CM | POA: Diagnosis not present

## 2022-01-24 DIAGNOSIS — O99820 Streptococcus B carrier state complicating pregnancy: Secondary | ICD-10-CM | POA: Diagnosis not present

## 2022-01-24 DIAGNOSIS — Z3A16 16 weeks gestation of pregnancy: Secondary | ICD-10-CM

## 2022-01-24 DIAGNOSIS — Z3480 Encounter for supervision of other normal pregnancy, unspecified trimester: Secondary | ICD-10-CM | POA: Insufficient documentation

## 2022-01-24 NOTE — Patient Instructions (Signed)

## 2022-01-24 NOTE — Progress Notes (Signed)
Subjective:   Emily Foster is a 21 y.o. G3P0020 at [redacted]w[redacted]d by LMP, early ultrasound being seen today for her first obstetrical visit.  Her obstetrical history is significant for  asthma . Patient does intend to breast feed. Pregnancy history fully reviewed.  Patient reports no complaints.  HISTORY: OB History  Gravida Para Term Preterm AB Living  3 0 0 0 2 0  SAB IAB Ectopic Multiple Live Births  2 0 0 0 0    # Outcome Date GA Lbr Len/2nd Weight Sex Delivery Anes PTL Lv  3 Current           2 SAB 2021          1 SAB             Past Medical History:  Diagnosis Date   Allergy    Asthma    UTI (urinary tract infection)    Past Surgical History:  Procedure Laterality Date   TOE SURGERY     Family History  Problem Relation Age of Onset   Diabetes Mother    Diabetes Paternal Grandmother    Diabetes Paternal Grandfather    Social History   Tobacco Use   Smoking status: Never   Smokeless tobacco: Never  Vaping Use   Vaping Use: Former  Substance Use Topics   Alcohol use: No   Drug use: No   Allergies  Allergen Reactions   Soy Allergy Itching, Swelling and Rash   Current Outpatient Medications on File Prior to Visit  Medication Sig Dispense Refill   Prenatal Vit-Fe Fumarate-FA (PREPLUS) 27-1 MG TABS Take 1 tablet by mouth daily. 30 tablet 13   terconazole (TERAZOL 7) 0.4 % vaginal cream Place 1 applicator vaginally at bedtime. 45 g 0   acetaminophen (TYLENOL) 500 MG tablet Take 1,000 mg by mouth every 6 (six) hours as needed. (Patient not taking: Reported on 01/24/2022)     albuterol (VENTOLIN HFA) 108 (90 Base) MCG/ACT inhaler TAKE 2 PUFFS BY MOUTH EVERY 6 HOURS AS NEEDED FOR WHEEZE OR SHORTNESS OF BREATH (Patient not taking: Reported on 01/24/2022) 6.7 each 2   Fluticasone Propionate, Inhal, 100 MCG/BLIST AEPB Inhale 1 puff into the lungs 2 (two) times daily. (Patient not taking: Reported on 01/24/2022) 28 each 1   No current facility-administered medications on  file prior to visit.     Exam   Vitals:   01/24/22 0935  BP: 107/80  Pulse: 97  Weight: 151 lb (68.5 kg)      Uterus:   16 wk size  Pelvic Exam: Perineum: no hemorrhoids, normal perineum   Vulva: normal external genitalia, no lesions   Vagina:  normal mucosa, normal discharge   Cervix: no lesions and normal, pap smear done.    Adnexa: normal adnexa and no mass, fullness, tenderness   Bony Pelvis: average  System: General: well-developed, well-nourished female in no acute distress   Skin: normal coloration and turgor, no rashes   Neurologic: oriented, normal, negative, normal mood   Extremities: normal strength, tone, and muscle mass, ROM of all joints is normal   HEENT PERRLA, extraocular movement intact and sclera clear, anicteric   Mouth/Teeth mucous membranes moist, pharynx normal without lesions and dental hygiene good   Neck supple and no masses   Cardiovascular: regular rate and rhythm   Respiratory:  no respiratory distress, normal breath sounds   Abdomen: soft, non-tender; bowel sounds normal; no masses,  no organomegaly     Assessment:  Pregnancy: [id number] Patient Active Problem List   Diagnosis Date Noted   Supervision of other normal pregnancy, antepartum 01/24/2022   GBS bacteriuria 01/24/2022   Chlamydia 01/30/2017     Plan:  1. Supervision of other normal pregnancy, antepartum New OB labs Pregnancy guidance - CBC/D/Plt+RPR+Rh+ABO+RubIgG... - Cytology - PAP( [facility]) - Hemoglobin A1c - CHL AMB BABYSCRIPTS OPT IN - [place] MFM OB COMP + 14 WK; Future - Panorama Prenatal Test Full Panel - HORIZON CUSTOM  2. GBS bacteriuria Will need treatment in labor   Initial labs drawn. Continue prenatal vitamins. Genetic Screening discussed, NIPS: ordered. Ultrasound discussed; fetal anatomic survey: ordered. Problem list reviewed and updated.  Routine obstetric precautions reviewed. Return in 4 weeks (on 02/21/2022).

## 2022-01-25 ENCOUNTER — Encounter: Payer: Self-pay | Admitting: Family Medicine

## 2022-01-25 DIAGNOSIS — Z2839 Other underimmunization status: Secondary | ICD-10-CM | POA: Insufficient documentation

## 2022-01-25 DIAGNOSIS — O09899 Supervision of other high risk pregnancies, unspecified trimester: Secondary | ICD-10-CM | POA: Insufficient documentation

## 2022-01-25 LAB — CBC/D/PLT+RPR+RH+ABO+RUBIGG...
Antibody Screen: NEGATIVE
Basophils Absolute: 0 10*3/uL (ref 0.0–0.2)
Basos: 0 %
EOS (ABSOLUTE): 0 10*3/uL (ref 0.0–0.4)
Eos: 1 %
HCV Ab: NONREACTIVE
HIV Screen 4th Generation wRfx: NONREACTIVE
Hematocrit: 32 % — ABNORMAL LOW (ref 34.0–46.6)
Hemoglobin: 10.8 g/dL — ABNORMAL LOW (ref 11.1–15.9)
Hepatitis B Surface Ag: NEGATIVE
Immature Grans (Abs): 0 10*3/uL (ref 0.0–0.1)
Immature Granulocytes: 0 %
Lymphocytes Absolute: 1.3 10*3/uL (ref 0.7–3.1)
Lymphs: 27 %
MCH: 28.5 pg (ref 26.6–33.0)
MCHC: 33.8 g/dL (ref 31.5–35.7)
MCV: 84 fL (ref 79–97)
Monocytes Absolute: 0.3 10*3/uL (ref 0.1–0.9)
Monocytes: 6 %
Neutrophils Absolute: 3.3 10*3/uL (ref 1.4–7.0)
Neutrophils: 66 %
Platelets: 243 10*3/uL (ref 150–450)
RBC: 3.79 x10E6/uL (ref 3.77–5.28)
RDW: 11.8 % (ref 11.7–15.4)
RPR Ser Ql: NONREACTIVE
Rh Factor: POSITIVE
Rubella Antibodies, IGG: <0.9 index — ABNORMAL LOW (ref >0.99)
WBC: 4.9 10*3/uL (ref 3.4–10.8)

## 2022-01-25 LAB — HEMOGLOBIN A1C
Est. average glucose Bld gHb Est-mCnc: 94 mg/dL
Hgb A1c MFr Bld: 4.9 % (ref 4.8–5.6)

## 2022-01-25 LAB — HCV INTERPRETATION

## 2022-01-30 LAB — PANORAMA PRENATAL TEST FULL PANEL:PANORAMA TEST PLUS 5 ADDITIONAL MICRODELETIONS: FETAL FRACTION: 5.9

## 2022-02-04 LAB — CYTOLOGY - PAP
Diagnosis: NEGATIVE
Diagnosis: REACTIVE

## 2022-02-04 LAB — HORIZON CUSTOM: REPORT SUMMARY: NEGATIVE

## 2022-02-14 ENCOUNTER — Ambulatory Visit: Payer: BC Managed Care – PPO

## 2022-02-19 ENCOUNTER — Encounter: Payer: BC Managed Care – PPO | Admitting: Advanced Practice Midwife

## 2022-02-27 DIAGNOSIS — Z3A20 20 weeks gestation of pregnancy: Secondary | ICD-10-CM | POA: Diagnosis not present

## 2022-02-27 DIAGNOSIS — Z3492 Encounter for supervision of normal pregnancy, unspecified, second trimester: Secondary | ICD-10-CM | POA: Diagnosis not present

## 2022-02-27 DIAGNOSIS — Z3482 Encounter for supervision of other normal pregnancy, second trimester: Secondary | ICD-10-CM | POA: Diagnosis not present

## 2022-03-12 ENCOUNTER — Telehealth: Payer: BC Managed Care – PPO | Admitting: Physician Assistant

## 2022-03-12 DIAGNOSIS — U071 COVID-19: Secondary | ICD-10-CM

## 2022-03-12 DIAGNOSIS — J069 Acute upper respiratory infection, unspecified: Secondary | ICD-10-CM

## 2022-03-13 ENCOUNTER — Encounter (HOSPITAL_COMMUNITY): Payer: Self-pay | Admitting: Obstetrics & Gynecology

## 2022-03-13 ENCOUNTER — Ambulatory Visit: Payer: BC Managed Care – PPO

## 2022-03-13 ENCOUNTER — Telehealth: Payer: Self-pay

## 2022-03-13 ENCOUNTER — Other Ambulatory Visit: Payer: BC Managed Care – PPO

## 2022-03-13 ENCOUNTER — Other Ambulatory Visit: Payer: Self-pay

## 2022-03-13 ENCOUNTER — Inpatient Hospital Stay (HOSPITAL_COMMUNITY)
Admission: AD | Admit: 2022-03-13 | Discharge: 2022-03-13 | Disposition: A | Discharge status: Home / Self Care | Payer: BC Managed Care – PPO | Attending: Obstetrics & Gynecology | Admitting: Obstetrics & Gynecology

## 2022-03-13 DIAGNOSIS — R519 Headache, unspecified: Secondary | ICD-10-CM | POA: Insufficient documentation

## 2022-03-13 DIAGNOSIS — R6883 Chills (without fever): Secondary | ICD-10-CM

## 2022-03-13 DIAGNOSIS — Z3A22 22 weeks gestation of pregnancy: Secondary | ICD-10-CM | POA: Diagnosis not present

## 2022-03-13 DIAGNOSIS — U071 COVID-19: Secondary | ICD-10-CM | POA: Insufficient documentation

## 2022-03-13 DIAGNOSIS — O4692 Antepartum hemorrhage, unspecified, second trimester: Secondary | ICD-10-CM | POA: Insufficient documentation

## 2022-03-13 DIAGNOSIS — J029 Acute pharyngitis, unspecified: Secondary | ICD-10-CM

## 2022-03-13 DIAGNOSIS — O26892 Other specified pregnancy related conditions, second trimester: Secondary | ICD-10-CM | POA: Insufficient documentation

## 2022-03-13 DIAGNOSIS — O368120 Decreased fetal movements, second trimester, not applicable or unspecified: Secondary | ICD-10-CM | POA: Insufficient documentation

## 2022-03-13 DIAGNOSIS — O98512 Other viral diseases complicating pregnancy, second trimester: Secondary | ICD-10-CM | POA: Insufficient documentation

## 2022-03-13 LAB — URINALYSIS, ROUTINE W REFLEX MICROSCOPIC
Bilirubin Urine: NEGATIVE
Glucose, UA: NEGATIVE mg/dL
Hgb urine dipstick: NEGATIVE
Ketones, ur: NEGATIVE mg/dL
Leukocytes,Ua: NEGATIVE
Nitrite: NEGATIVE
Protein, ur: NEGATIVE mg/dL
Specific Gravity, Urine: 1.016 (ref 1.005–1.030)
pH: 8 (ref 5.0–8.0)

## 2022-03-13 LAB — RESP PANEL BY RT-PCR (FLU A&B, COVID) ARPGX2
Influenza A by PCR: NEGATIVE
Influenza B by PCR: NEGATIVE
SARS Coronavirus 2 by RT PCR: POSITIVE — AB

## 2022-03-13 LAB — WET PREP, GENITAL
Clue Cells Wet Prep HPF POC: NONE SEEN
Sperm: NONE SEEN
Trich, Wet Prep: NONE SEEN
WBC, Wet Prep HPF POC: >=10 — AB (ref <10)
Yeast Wet Prep HPF POC: NONE SEEN

## 2022-03-13 MED ORDER — ACETAMINOPHEN 500 MG PO TABS
1000.0000 mg | ORAL_TABLET | Freq: Once | ORAL | Status: AC
  Administered 2022-03-13: 1000 mg via ORAL
  Filled 2022-03-13: qty 2

## 2022-03-13 NOTE — Progress Notes (Signed)
I have spent 5 minutes in review of e-visit questionnaire, review and updating patient chart, medical decision making and response to patient.   Willisha Sligar Cody Trishelle Devora, PA-C    

## 2022-03-13 NOTE — Discharge Instructions (Signed)

## 2022-03-13 NOTE — MAU Provider Note (Signed)
History     CSN: [id number]  Arrival date and time: 03/13/22 1221   Event Date/Time   First Provider Initiated Contact with Patient 03/13/22 1335      Chief Complaint  Patient presents with   Vaginal Bleeding   Headache   Cough   Congestion   Body Aches   HPI  [name] is a 21 y.o. female G3P0020 here with aches, sore throat, congestion, vaginal bleeding and DFM. Symptoms started 2 days ago. She has not tried anything over the counter for the symptoms. She is able to eat and drink normally. Reports some spotting when she wipes. She last saw this today. No recent intercourse. No sick contacts.   OB History     Gravida  3   Para  0   Term  0   Preterm  0   AB  2   Living  0      SAB  2   IAB  0   Ectopic  0   Multiple  0   Live Births  0           Past Medical History:  Diagnosis Date   Allergy    Asthma    UTI (urinary tract infection)     Past Surgical History:  Procedure Laterality Date   TOE SURGERY      Family History  Problem Relation Age of Onset   Diabetes Mother    Diabetes Paternal Grandmother    Diabetes Paternal Grandfather     Social History   Tobacco Use   Smoking status: Never   Smokeless tobacco: Never  Vaping Use   Vaping Use: Former  Substance Use Topics   Alcohol use: No   Drug use: No    Allergies:  Allergies  Allergen Reactions   Soy Allergy Itching, Swelling and Rash    Medications Prior to Admission  Medication Sig Dispense Refill Last Dose   Prenatal Vit-Fe Fumarate-FA (PREPLUS) 27-1 MG TABS Take 1 tablet by mouth daily. 30 tablet 13 03/13/2022   acetaminophen (TYLENOL) 500 MG tablet Take 1,000 mg by mouth every 6 (six) hours as needed. (Patient not taking: Reported on 01/24/2022)      albuterol (VENTOLIN HFA) 108 (90 Base) MCG/ACT inhaler TAKE 2 PUFFS BY MOUTH EVERY 6 HOURS AS NEEDED FOR WHEEZE OR SHORTNESS OF BREATH (Patient not taking: Reported on 01/24/2022) 6.7 each 2    Fluticasone  Propionate, Inhal, 100 MCG/BLIST AEPB Inhale 1 puff into the lungs 2 (two) times daily. (Patient not taking: Reported on 01/24/2022) 28 each 1    terconazole (TERAZOL 7) 0.4 % vaginal cream Place 1 applicator vaginally at bedtime. 45 g 0    Results for orders placed or performed during the hospital encounter of 03/13/22 (from the past 48 hour(s))  Urinalysis, Routine w reflex microscopic Urine, Clean Catch     Status: Abnormal   Collection Time: 03/13/22  1:37 PM  Result Value Ref Range   Color, Urine AMBER (A) YELLOW    Comment: BIOCHEMICALS MAY BE AFFECTED BY COLOR   APPearance TURBID (A) CLEAR   Specific Gravity, Urine 1.016 1.005 - 1.030   pH 8.0 5.0 - 8.0   Glucose, UA NEGATIVE NEGATIVE mg/dL   Hgb urine dipstick NEGATIVE NEGATIVE   Bilirubin Urine NEGATIVE NEGATIVE   Ketones, ur NEGATIVE NEGATIVE mg/dL   Protein, ur NEGATIVE NEGATIVE mg/dL   Nitrite NEGATIVE NEGATIVE   Leukocytes,Ua NEGATIVE NEGATIVE   RBC / HPF 0-5 0 - 5  RBC/hpf   WBC, UA 0-5 0 - 5 WBC/hpf   Bacteria, UA FEW (A) NONE SEEN   Squamous Epithelial / LPF 6-10 0 - 5   Mucus PRESENT     Comment: Performed at [facility], [address], [place], [place] [address]  Resp Panel by RT-PCR (Flu A&B, Covid)     Status: Abnormal   Collection Time: 03/13/22  1:53 PM   Specimen: Nasal Swab  Result Value Ref Range   SARS Coronavirus 2 by RT PCR POSITIVE (A) NEGATIVE    Comment: (NOTE) SARS-CoV-2 target nucleic acids are DETECTED.  The SARS-CoV-2 RNA is generally detectable in upper respiratory specimens during the acute phase of infection. Positive results are indicative of the presence of the identified virus, but do not rule out bacterial infection or co-infection with other pathogens not detected by the test. Clinical correlation with patient history and other diagnostic information is necessary to determine patient infection status. The expected result is Negative.  Fact Sheet for  Patients: BloggerCourse.com  Fact Sheet for Healthcare Providers: SeriousBroker.it  This test is not yet approved or cleared by the [place] FDA and  has been authorized for detection and/or diagnosis of SARS-CoV-2 by FDA under an Emergency Use Authorization (EUA).  This EUA will remain in effect (meaning this test can be used) for the duration of  the COVID-19 declaration under Section 564(b)(1) of the A ct, 21 U.S.C. section 360bbb-3(b)(1), unless the authorization is terminated or revoked sooner.     Influenza A by PCR NEGATIVE NEGATIVE   Influenza B by PCR NEGATIVE NEGATIVE    Comment: (NOTE) The Xpert Xpress SARS-CoV-2/FLU/RSV plus assay is intended as an aid in the diagnosis of influenza from Nasopharyngeal swab specimens and should not be used as a sole basis for treatment. Nasal washings and aspirates are unacceptable for Xpert Xpress SARS-CoV-2/FLU/RSV testing.  Fact Sheet for Patients: BloggerCourse.com  Fact Sheet for Healthcare Providers: SeriousBroker.it  This test is not yet approved or cleared by the [place] FDA and has been authorized for detection and/or diagnosis of SARS-CoV-2 by FDA under an Emergency Use Authorization (EUA). This EUA will remain in effect (meaning this test can be used) for the duration of the COVID-19 declaration under Section 564(b)(1) of the Act, 21 U.S.C. section 360bbb-3(b)(1), unless the authorization is terminated or revoked.  Performed at [facility], [address] [place], [place] [address]   Wet prep, genital     Status: Abnormal   Collection Time: 03/13/22  2:02 PM  Result Value Ref Range   Yeast Wet Prep HPF POC NONE SEEN NONE SEEN   Trich, Wet Prep NONE SEEN NONE SEEN   Clue Cells Wet Prep HPF POC NONE SEEN NONE SEEN   WBC, Wet Prep HPF POC >=10 (A) <10   Sperm NONE SEEN     Comment: Performed at [facility], [address] [place], [place] [address]    Review of Systems  Constitutional:  Positive for appetite change, chills and fatigue. Negative for fever.  HENT:  Positive for congestion, rhinorrhea, sneezing and sore throat.   Gastrointestinal:  Negative for abdominal pain.  Neurological:  Positive for headaches.   Physical Exam   Blood pressure 119/75, pulse 95, temperature 98.3 F (36.8 C), resp. rate 20, height 5\' 5"  (1.651 m), weight 77.2 kg, last menstrual period 10/04/2021, SpO2 100 %.  Physical Exam Constitutional:      General: She is not in acute  distress.    Appearance: She is well-developed. She is not ill-appearing, toxic-appearing or diaphoretic.  HENT:     Head: Normocephalic.  Eyes:     Pupils: Pupils are equal, round, and reactive to light.  Cardiovascular:     Rate and Rhythm: Normal rate.  Pulmonary:     Effort: Pulmonary effort is normal.     Breath sounds: Normal breath sounds.  Musculoskeletal:     Cervical back: Normal range of motion.  Skin:    General: Skin is warm.  Neurological:     Mental Status: She is alert and oriented to person, place, and time.     Comments: Vagina - Small amount of white vaginal discharge, no odor  Cervix - No contact bleeding, no active bleeding  Bimanual exam: deferred  GC/Chlam, wet prep done Chaperone present for exam.    Psychiatric:        Mood and Affect: Mood normal.    MAU Course  Procedures  MDM  +Fetal doppler  Covid swab/ flu  Strep Swab collected   Assessment and Plan   A:  1. Chills   2. Nonintractable headache, unspecified chronicity pattern, unspecified headache type   3. [redacted] weeks gestation of pregnancy   4. COVID-19   5. Sore throat      P:  Dc home Over the counter medications discussed Increase oral fluids Ok to use tylenol OTC as directed  Return precautions   Shalin Linders, Artist Pais, NP. 03/13/2022 7:26 PM

## 2022-03-13 NOTE — Progress Notes (Signed)
E-Visit for Upper Respiratory Infection   We are sorry you are not feeling well.  Here is how we plan to help!  Based on what you have shared with me, it looks like you may have a viral upper respiratory infection.  Upper respiratory infections are caused by a large number of viruses; however, rhinovirus is the most common cause.   Giving pregnancy status and rising rates in our community, I do recommend taking a home COVID test to be cautious. If positive, let us know ASAP.  Symptoms vary from person to person, with common symptoms including sore throat, cough, fatigue or lack of energy and feeling of general discomfort.  A low-grade fever of up to 100.4 may present, but is often uncommon.  Symptoms vary however, and are closely related to a person's age or underlying illnesses.  The most common symptoms associated with an upper respiratory infection are nasal discharge or congestion, cough, sneezing, headache and pressure in the ears and face.  These symptoms usually persist for about 3 to 10 days, but can last up to 2 weeks.  It is important to know that upper respiratory infections do not cause serious illness or complications in most cases.    Upper respiratory infections can be transmitted from person to person, with the most common method of transmission being a person's hands.  The virus is able to live on the skin and can infect other persons for up to 2 hours after direct contact.  Also, these can be transmitted when someone coughs or sneezes; thus, it is important to cover the mouth to reduce this risk.  To keep the spread of the illness at bay, good hand hygiene is very important.  This is an infection that is most likely caused by a virus. There are no specific treatments other than to help you with the symptoms until the infection runs its course.  We are sorry you are not feeling well.  Here is how we plan to help!   For nasal congestion, you may use an use plain Mucinex.  Saline nasal  spray or nasal drops can help and can safely be used as often as needed for congestion.    If you do not have a history of heart disease, hypertension, diabetes or thyroid disease, prostate/bladder issues or glaucoma, you may also use Sudafed to treat nasal congestion.  It is highly recommended that you consult with a pharmacist or your primary care physician to ensure this medication is safe for you to take.     If you have a cough, you may use cough suppressants such as Delsym and Robitussin.    If you have a sore or scratchy throat, use a saltwater gargle-  to  teaspoon of salt dissolved in a 4-ounce to 8-ounce glass of warm water.  Gargle the solution for approximately 15-30 seconds and then spit.  It is important not to swallow the solution.  You can also use throat lozenges/cough drops and Chloraseptic spray to help with throat pain or discomfort.  Warm or cold liquids can also be helpful in relieving throat pain.  For headache, pain or general discomfort, you can use Tylenol as directed.      HOME CARE Only take medications as instructed by your medical team. Be sure to drink plenty of fluids. Water is fine as well as fruit juices, sodas and electrolyte beverages. You may want to stay away from caffeine or alcohol. If you are nauseated, try taking small sips of  liquids. How do you know if you are getting enough fluid? Your urine should be a pale yellow or almost colorless. Get rest. Taking a steamy shower or using a humidifier may help nasal congestion and ease sore throat pain. You can place a towel over your head and breathe in the steam from hot water coming from a faucet. Using a saline nasal spray works much the same way. Cough drops, hard candies and sore throat lozenges may ease your cough. Avoid close contacts especially the very young and the elderly Cover your mouth if you cough or sneeze Always remember to wash your hands.   GET HELP RIGHT AWAY IF: You develop worsening  fever. If your symptoms do not improve within 10 days You develop yellow or green discharge from your nose over 3 days. You have coughing fits You develop a severe head ache or visual changes. You develop shortness of breath, difficulty breathing or start having chest pain Your symptoms persist after you have completed your treatment plan  MAKE SURE YOU  Understand these instructions. Will watch your condition. Will get help right away if you are not doing well or get worse.  Thank you for choosing an e-visit.  Your e-visit answers were reviewed by a board certified advanced clinical practitioner to complete your personal care plan. Depending upon the condition, your plan could have included both over the counter or prescription medications.  Please review your pharmacy choice. Make sure the pharmacy is open so you can pick up prescription now. If there is a problem, you may contact your provider through [employer] and have the prescription routed to another pharmacy.  Your safety is important to [place]. If you have drug allergies check your prescription carefully.   For the next 24 hours you can use MyChart to ask questions about today's visit, request a non-urgent call back, or ask for a work or school excuse. You will get an email in the next two days asking about your experience. I hope that your e-visit has been valuable and will speed your recovery.

## 2022-03-13 NOTE — MAU Note (Signed)
Emily Foster is a 21 y.o. at [redacted]w[redacted]d here in MAU reporting: she's having spotting with wiping for last 2 days & hasn't felt FM for 2 days.  Denies recent intercourse.  Also states wants to be tested for Covid, reports H/A, body aches, cough, congestion, sore scratchy throat, and runny nose.  Hasn't taken any meds, unsure what can be taken. LMP: N/A Onset of complaint: yesterday Pain score: 10 There were no vitals filed for this visit.   FHT:166 bpm Lab orders placed from triage:   UA

## 2022-03-13 NOTE — Telephone Encounter (Signed)
Patient called stating she missed her appt this morning 03/13/2022 at 9:30am, patient calling to rescheduled, offered an appointment on 04/03/2022 at 1:30pm. Patient refuse the appointment and stated she does not need the ultrasound.

## 2022-03-14 ENCOUNTER — Telehealth: Payer: Self-pay

## 2022-03-14 LAB — CULTURE, OB URINE

## 2022-03-14 LAB — GC/CHLAMYDIA PROBE AMP (~~LOC~~) NOT AT ARMC
Chlamydia: NEGATIVE
Comment: NEGATIVE
Comment: NORMAL
Neisseria Gonorrhea: NEGATIVE

## 2022-03-14 NOTE — Addendum Note (Signed)
Addended by: Waldon Merl on: 03/14/2022 02:12 PM   Modules accepted: Orders

## 2022-03-14 NOTE — Telephone Encounter (Signed)
Called pt in regards to COVID questionnaire. No answer, left vm to call community line with questions.

## 2022-03-14 NOTE — Progress Notes (Signed)
    E-Visit  for Positive Covid Test Result  We are sorry you are not feeling well. We are here to help!  You have tested positive for COVID-19, meaning that you were infected with the novel coronavirus and could give the virus to others.  It is vitally important that you stay home so you do not spread it to others.      Please continue isolation at home, for at least 10 days since the start of your symptoms and until you have had 24 hours with no fever (without taking a fever reducer) and with improving of symptoms.  If you have no symptoms but tested positive (or all symptoms resolve after 5 days and you have no fever) you can leave your house but continue to wear a mask around others for an additional 5 days. If you have a fever,continue to stay home until you have had 24 hours of no fever. Most cases improve 5-10 days from onset but we have seen a small number of patients who have gotten worse after the 10 days.  Please be sure to watch for worsening symptoms and remain taking the proper precautions.   Go to the nearest hospital ED for assessment if fever/cough/breathlessness are severe or illness seems like a threat to life.    The following symptoms may appear 2-14 days after exposure: Fever Cough Shortness of breath or difficulty breathing Chills Repeated shaking with chills Muscle pain Headache Sore throat New loss of taste or smell Fatigue Congestion or runny nose Nausea or vomiting Diarrhea  You have been enrolled in MyChart Home Monitoring for COVID-19. Daily you will receive a questionnaire within the MyChart website. Our COVID-19 response team will be monitoring your responses daily.   You may also take acetaminophen (Tylenol) as needed for fever.  HOME CARE: Only take medications as instructed by your medical team. Drink plenty of fluids and get plenty of rest. A steam or ultrasonic humidifier can help if you have congestion.   GET HELP RIGHT AWAY IF YOU HAVE  EMERGENCY WARNING SIGNS.  Call 911 or proceed to your closest emergency facility if: You develop worsening high fever. Trouble breathing Bluish lips or face Persistent pain or pressure in the chest New confusion Inability to wake or stay awake You cough up blood. Your symptoms become more severe Inability to hold down food or fluids  This list is not all possible symptoms. Contact your medical provider for any symptoms that are severe or concerning to you.    Your e-visit answers were reviewed by a board certified advanced clinical practitioner to complete your personal care plan.  Depending on the condition, your plan could have included both over the counter or prescription medications.  If there is a problem please reply once you have received a response from your provider.  Your safety is important to [place].  If you have drug allergies check your prescription carefully.    You can use MyChart to ask questions about today's visit, request a non-urgent call back, or ask for a work or school excuse for 24 hours related to this e-Visit. If it has been greater than 24 hours you will need to follow up with your provider, or enter a new e-Visit to address those concerns. You will get an e-mail in the next two days asking about your experience.  I hope that your e-visit has been valuable and will speed your recovery. Thank you for using e-visits.

## 2022-03-16 LAB — CULTURE, GROUP A STREP (THRC)

## 2022-03-19 ENCOUNTER — Telehealth (INDEPENDENT_AMBULATORY_CARE_PROVIDER_SITE_OTHER): Payer: BC Managed Care – PPO | Admitting: Advanced Practice Midwife

## 2022-03-19 ENCOUNTER — Telehealth: Payer: Self-pay

## 2022-03-19 DIAGNOSIS — O99820 Streptococcus B carrier state complicating pregnancy: Secondary | ICD-10-CM

## 2022-03-19 DIAGNOSIS — Z3A23 23 weeks gestation of pregnancy: Secondary | ICD-10-CM

## 2022-03-19 DIAGNOSIS — Z3480 Encounter for supervision of other normal pregnancy, unspecified trimester: Secondary | ICD-10-CM

## 2022-03-19 DIAGNOSIS — A749 Chlamydial infection, unspecified: Secondary | ICD-10-CM

## 2022-03-19 DIAGNOSIS — O98813 Other maternal infectious and parasitic diseases complicating pregnancy, third trimester: Secondary | ICD-10-CM | POA: Diagnosis not present

## 2022-03-19 DIAGNOSIS — R8271 Bacteriuria: Secondary | ICD-10-CM

## 2022-03-19 DIAGNOSIS — Z2839 Other underimmunization status: Secondary | ICD-10-CM

## 2022-03-19 NOTE — Progress Notes (Signed)
   OBSTETRICS PRENATAL VIRTUAL VISIT ENCOUNTER NOTE  Provider location: Center for [place] [facility]   Patient location: Home  I connected with Emily Foster on 03/19/22 at 10:35 AM EDT by MyChart Video Encounter and verified that I am speaking with the correct person using two identifiers. I discussed the limitations, risks, security and privacy concerns of performing an evaluation and management service virtually and the availability of in person appointments. I also discussed with the patient that there may be a patient responsible charge related to this service. The patient expressed understanding and agreed to proceed. Subjective:  Emily Foster is a 21 y.o. G3P0020 at [redacted]w[redacted]d being seen today for ongoing prenatal care.  She is currently monitored for the following issues for this low-risk pregnancy and has Chlamydia; Supervision of other normal pregnancy, antepartum; GBS bacteriuria; and Rubella non-immune status, antepartum on their problem list.  Patient reports  improving Covid symptoms. Had fever a few day ago.  Has congestion and sore throat.  Did E-Visit and went to MAU to be seen.. States missed [place] because of covid.  States not taking anything "because I didn't know if I could take anything" .  Contractions: Not present. Vag. Bleeding: None.  Movement: Present. Denies any leaking of fluid.   The following portions of the patient's history were reviewed and updated as appropriate: allergies, current medications, past family history, past medical history, past social history, past surgical history and problem list.   Objective:  There were no vitals filed for this visit.  Fetal Status:     Movement: Present     General:  Alert, oriented and cooperative. Patient is in no acute distress.  Respiratory: Normal respiratory effort, no problems with respiration noted  Mental Status: Normal mood and affect. Normal behavior. Normal judgment and thought content.  Rest  of physical exam deferred due to type of encounter  Imaging: No results found.  Assessment and Plan:  Pregnancy: [id number] at [redacted]w[redacted]d 1. Supervision of other normal pregnancy, antepartum      Will reschedule [place], pt states no one offered to reschedule.    2. GBS bacteriuria     Treat in labor  3. Rubella non-immune status, antepartum    4. [redacted] weeks gestation of pregnancy     Discussed fetal movement  5.   Covid     Reviewed covid care and meds she can take  Preterm labor symptoms and general obstetric precautions including but not limited to vaginal bleeding, contractions, leaking of fluid and fetal movement were reviewed in detail with the patient. I discussed the assessment and treatment plan with the patient. The patient was provided an opportunity to ask questions and all were answered. The patient agreed with the plan and demonstrated an understanding of the instructions. The patient was advised to call back or seek an in-person office evaluation/go to MAU at [facility] for any urgent or concerning symptoms. Please refer to After Visit Summary for other counseling recommendations.   I provided 6 minutes of face-to-face time during this encounter.    Future Appointments  Date Time Provider [facility]  04/16/2022  8:15 AM Laury Deep, CNM [facility] None  04/30/2022  8:15 AM Jimmye Norman Wilkie Aye, CNM [facility] None    Hansel Feinstein, [place] for [employer], [place]

## 2022-03-19 NOTE — Telephone Encounter (Signed)
Attempted to reach patient to start virtual visit. Emily Foster

## 2022-03-19 NOTE — Progress Notes (Signed)
Patient doing virtual visit today due to covid +. Patient agrees to do visit virtually. Anderson Malta [facility]

## 2022-03-25 ENCOUNTER — Encounter: Payer: Self-pay | Admitting: *Deleted

## 2022-03-25 ENCOUNTER — Telehealth: Payer: Self-pay

## 2022-03-25 NOTE — Telephone Encounter (Signed)
Called patient in regards to the [place]  questions competed in my chart of worse symptoms of cough. No answer , left vm to call back if she has any concerns.

## 2022-04-11 ENCOUNTER — Telehealth: Payer: BC Managed Care – PPO | Admitting: Family

## 2022-04-11 DIAGNOSIS — R112 Nausea with vomiting, unspecified: Secondary | ICD-10-CM

## 2022-04-11 DIAGNOSIS — Z3490 Encounter for supervision of normal pregnancy, unspecified, unspecified trimester: Secondary | ICD-10-CM

## 2022-04-11 NOTE — Progress Notes (Signed)
For the safety of you and your child, I recommend a face to face office visit with a health care provider.  Many mothers need to take medicines during their pregnancy and while nursing.  Almost all medicines pass into the breast milk in small quantities.  Most are generally considered safe for a mother to take but some medicines must be avoided.  After reviewing your E-Visit request, I recommend that you consult your OB/GYN or pediatrician for medical advice in relation to your condition and prescription medications while pregnant or breastfeeding.  NOTE:  There will be NO CHARGE for this eVisit  If you are having a true medical emergency please call 911.    For an urgent face to face visit, [place] has six urgent care centers for your convenience:     [facility] [place] Get Driving Directions [phone] [address] [place], [place] [address]    [facility] ([place]) Get Driving Directions [phone] [address] [place], [place] [address]  [facility] ([place]) Get Driving Directions [phone] [address] [place],  [place]  [address]  [facility] Get Driving Directions [phone] [address] [place], [place] [address]   [facility] [place] Get Driving Directions  [phone] [address] [place], [place] [address]   [facility] Get Driving Directions [phone] [address] [place],  [address]  Your MyChart E-visit questionnaire answers were reviewed by a board certified advanced clinical practitioner to complete your personal care plan based on your specific symptoms.  Thank you for using e-Visits. 

## 2022-04-16 ENCOUNTER — Ambulatory Visit: Payer: BC Managed Care – PPO

## 2022-04-16 ENCOUNTER — Telehealth: Payer: Self-pay

## 2022-04-16 ENCOUNTER — Encounter: Payer: BC Managed Care – PPO | Admitting: Obstetrics and Gynecology

## 2022-04-16 NOTE — Telephone Encounter (Signed)
Pt called and stated she was going to be late, I offered an appointment 04/30/2022 at 7:30am, patient stated it too early, then 1:15pm she was unable to make due to class.Also, offered 05/08/2022 at 1:30pm,  patient hangs up.

## 2022-04-23 ENCOUNTER — Inpatient Hospital Stay (HOSPITAL_COMMUNITY): Payer: BC Managed Care – PPO | Admitting: Certified Registered"

## 2022-04-23 ENCOUNTER — Encounter (HOSPITAL_COMMUNITY): Payer: Self-pay | Admitting: Obstetrics and Gynecology

## 2022-04-23 ENCOUNTER — Inpatient Hospital Stay (HOSPITAL_COMMUNITY)
Admission: AD | Admit: 2022-04-23 | Discharge: 2022-04-25 | DRG: 788 | Disposition: A | Discharge status: Home / Self Care | Payer: BC Managed Care – PPO | Attending: Obstetrics & Gynecology | Admitting: Obstetrics & Gynecology

## 2022-04-23 ENCOUNTER — Encounter (HOSPITAL_COMMUNITY)
Admission: AD | Disposition: A | Discharge status: Home / Self Care | Payer: Self-pay | Source: Home / Self Care | Attending: Obstetrics & Gynecology

## 2022-04-23 DIAGNOSIS — O1414 Severe pre-eclampsia complicating childbirth: Principal | ICD-10-CM | POA: Diagnosis present

## 2022-04-23 DIAGNOSIS — Z3480 Encounter for supervision of other normal pregnancy, unspecified trimester: Secondary | ICD-10-CM

## 2022-04-23 DIAGNOSIS — O365930 Maternal care for other known or suspected poor fetal growth, third trimester, not applicable or unspecified: Secondary | ICD-10-CM | POA: Diagnosis not present

## 2022-04-23 DIAGNOSIS — O4593 Premature separation of placenta, unspecified, third trimester: Secondary | ICD-10-CM

## 2022-04-23 DIAGNOSIS — O09899 Supervision of other high risk pregnancies, unspecified trimester: Secondary | ICD-10-CM

## 2022-04-23 DIAGNOSIS — O43813 Placental infarction, third trimester: Secondary | ICD-10-CM | POA: Diagnosis not present

## 2022-04-23 DIAGNOSIS — L299 Pruritus, unspecified: Secondary | ICD-10-CM | POA: Diagnosis not present

## 2022-04-23 DIAGNOSIS — R519 Headache, unspecified: Secondary | ICD-10-CM | POA: Diagnosis not present

## 2022-04-23 DIAGNOSIS — Z3A28 28 weeks gestation of pregnancy: Secondary | ICD-10-CM

## 2022-04-23 DIAGNOSIS — O26893 Other specified pregnancy related conditions, third trimester: Secondary | ICD-10-CM | POA: Diagnosis not present

## 2022-04-23 DIAGNOSIS — O43893 Other placental disorders, third trimester: Secondary | ICD-10-CM | POA: Diagnosis not present

## 2022-04-23 DIAGNOSIS — O99820 Streptococcus B carrier state complicating pregnancy: Secondary | ICD-10-CM | POA: Diagnosis not present

## 2022-04-23 DIAGNOSIS — O368130 Decreased fetal movements, third trimester, not applicable or unspecified: Secondary | ICD-10-CM | POA: Diagnosis present

## 2022-04-23 DIAGNOSIS — Z98891 History of uterine scar from previous surgery: Secondary | ICD-10-CM

## 2022-04-23 DIAGNOSIS — O1494 Unspecified pre-eclampsia, complicating childbirth: Secondary | ICD-10-CM | POA: Diagnosis not present

## 2022-04-23 DIAGNOSIS — O1410 Severe pre-eclampsia, unspecified trimester: Secondary | ICD-10-CM

## 2022-04-23 DIAGNOSIS — Z2839 Other underimmunization status: Secondary | ICD-10-CM

## 2022-04-23 DIAGNOSIS — R8271 Bacteriuria: Secondary | ICD-10-CM | POA: Diagnosis present

## 2022-04-23 LAB — URINALYSIS, ROUTINE W REFLEX MICROSCOPIC
Bilirubin Urine: NEGATIVE
Glucose, UA: NEGATIVE mg/dL
Hgb urine dipstick: NEGATIVE
Ketones, ur: NEGATIVE mg/dL
Leukocytes,Ua: NEGATIVE
Nitrite: NEGATIVE
Protein, ur: >=300 mg/dL — AB
Specific Gravity, Urine: 1.027 (ref 1.005–1.030)
pH: 6 (ref 5.0–8.0)

## 2022-04-23 LAB — COMPREHENSIVE METABOLIC PANEL
ALT: 18 U/L (ref 0–44)
AST: 24 U/L (ref 15–41)
Albumin: 2.3 g/dL — ABNORMAL LOW (ref 3.5–5.0)
Alkaline Phosphatase: 94 U/L (ref 38–126)
Anion gap: 6 (ref 5–15)
BUN: 8 mg/dL (ref 6–20)
CO2: 23 mmol/L (ref 22–32)
Calcium: 8.2 mg/dL — ABNORMAL LOW (ref 8.9–10.3)
Chloride: 110 mmol/L (ref 98–111)
Creatinine, Ser: 0.77 mg/dL (ref 0.44–1.00)
GFR, Estimated: >60 mL/min (ref >60)
Glucose, Bld: 90 mg/dL (ref 70–99)
Potassium: 3.8 mmol/L (ref 3.5–5.1)
Sodium: 139 mmol/L (ref 135–145)
Total Bilirubin: 0.4 mg/dL (ref 0.3–1.2)
Total Protein: 5.3 g/dL — ABNORMAL LOW (ref 6.5–8.1)

## 2022-04-23 LAB — PROTEIN / CREATININE RATIO, URINE
Creatinine, Urine: 91 mg/dL
Protein Creatinine Ratio: 29.59 mg/mg{Cre} — ABNORMAL HIGH (ref 0.00–0.15)
Total Protein, Urine: 2693 mg/dL

## 2022-04-23 LAB — TYPE AND SCREEN
ABO/RH(D): AB POS
Antibody Screen: NEGATIVE

## 2022-04-23 LAB — CBC
HCT: 33.8 % — ABNORMAL LOW (ref 36.0–46.0)
Hemoglobin: 11.5 g/dL — ABNORMAL LOW (ref 12.0–15.0)
MCH: 29.3 pg (ref 26.0–34.0)
MCHC: 34 g/dL (ref 30.0–36.0)
MCV: 86.2 fL (ref 80.0–100.0)
Platelets: 165 10*3/uL (ref 150–400)
RBC: 3.92 MIL/uL (ref 3.87–5.11)
RDW: 12.9 % (ref 11.5–15.5)
WBC: 12.2 10*3/uL — ABNORMAL HIGH (ref 4.0–10.5)
nRBC: 0.2 % (ref 0.0–0.2)

## 2022-04-23 SURGERY — Surgical Case
Anesthesia: General

## 2022-04-23 MED ORDER — ROCURONIUM BROMIDE 100 MG/10ML IV SOLN
INTRAVENOUS | Status: DC | PRN
  Administered 2022-04-23: 40 mg via INTRAVENOUS

## 2022-04-23 MED ORDER — OXYCODONE HCL 5 MG/5ML PO SOLN: 5.0000 mg | Freq: Once | ORAL | Status: DC | PRN

## 2022-04-23 MED ORDER — ACETAMINOPHEN 500 MG PO TABS
1000.0000 mg | ORAL_TABLET | Freq: Four times a day (QID) | ORAL | Status: AC
  Administered 2022-04-24 (×3): 1000 mg via ORAL
  Filled 2022-04-23 (×3): qty 2

## 2022-04-23 MED ORDER — CYCLOBENZAPRINE HCL 5 MG PO TABS
10.0000 mg | ORAL_TABLET | Freq: Once | ORAL | Status: AC
  Administered 2022-04-23: 10 mg via ORAL

## 2022-04-23 MED ORDER — MAGNESIUM SULFATE 40 GM/1000ML IV SOLN
INTRAVENOUS | Status: AC
  Filled 2022-04-23: qty 1000

## 2022-04-23 MED ORDER — MIDAZOLAM HCL 2 MG/2ML IJ SOLN
INTRAMUSCULAR | Status: AC
  Filled 2022-04-23: qty 2

## 2022-04-23 MED ORDER — AMISULPRIDE (ANTIEMETIC) 5 MG/2ML IV SOLN: 10.0000 mg | Freq: Once | INTRAVENOUS | Status: DC | PRN

## 2022-04-23 MED ORDER — HYDRALAZINE HCL 20 MG/ML IJ SOLN
10.0000 mg | INTRAMUSCULAR | Status: DC | PRN
  Administered 2022-04-23: 10 mg via INTRAVENOUS

## 2022-04-23 MED ORDER — LIDOCAINE HCL (CARDIAC) PF 100 MG/5ML IV SOSY
PREFILLED_SYRINGE | INTRAVENOUS | Status: DC | PRN
  Administered 2022-04-23: 60 mg via INTRAVENOUS

## 2022-04-23 MED ORDER — DIPHENHYDRAMINE HCL 50 MG/ML IJ SOLN: 12.5000 mg | INTRAMUSCULAR | Status: DC | PRN

## 2022-04-23 MED ORDER — LACTATED RINGERS IV BOLUS
500.0000 mL | Freq: Once | INTRAVENOUS | Status: AC
  Administered 2022-04-23: 500 mL via INTRAVENOUS

## 2022-04-23 MED ORDER — SODIUM CHLORIDE 0.9 % IV SOLN: INTRAVENOUS | Status: DC | PRN

## 2022-04-23 MED ORDER — LACTATED RINGERS IV SOLN: INTRAVENOUS | Status: DC | PRN

## 2022-04-23 MED ORDER — BETAMETHASONE SOD PHOS & ACET 6 (3-3) MG/ML IJ SUSP
INTRAMUSCULAR | Status: AC
  Filled 2022-04-23: qty 5

## 2022-04-23 MED ORDER — DEXAMETHASONE SODIUM PHOSPHATE 10 MG/ML IJ SOLN
INTRAMUSCULAR | Status: DC | PRN
  Administered 2022-04-23: 10 mg via INTRAVENOUS

## 2022-04-23 MED ORDER — KETOROLAC TROMETHAMINE 30 MG/ML IJ SOLN: 30.0000 mg | Freq: Four times a day (QID) | INTRAMUSCULAR | Status: AC | PRN

## 2022-04-23 MED ORDER — SCOPOLAMINE 1 MG/3DAYS TD PT72
MEDICATED_PATCH | TRANSDERMAL | Status: AC
  Filled 2022-04-23: qty 1

## 2022-04-23 MED ORDER — MIDAZOLAM HCL 2 MG/2ML IJ SOLN
INTRAMUSCULAR | Status: DC | PRN
  Administered 2022-04-23 – 2022-04-24 (×2): 2 mg via INTRAVENOUS

## 2022-04-23 MED ORDER — OXYCODONE HCL 5 MG PO TABS: 5.0000 mg | ORAL_TABLET | Freq: Once | ORAL | Status: DC | PRN

## 2022-04-23 MED ORDER — CYCLOBENZAPRINE HCL 5 MG PO TABS
ORAL_TABLET | ORAL | Status: AC
  Filled 2022-04-23: qty 2

## 2022-04-23 MED ORDER — BETAMETHASONE SOD PHOS & ACET 6 (3-3) MG/ML IJ SUSP
12.0000 mg | INTRAMUSCULAR | Status: DC
  Administered 2022-04-23: 12 mg via INTRAMUSCULAR

## 2022-04-23 MED ORDER — HYDROMORPHONE HCL 1 MG/ML IJ SOLN
INTRAMUSCULAR | Status: DC | PRN
  Administered 2022-04-23 – 2022-04-24 (×2): 1 mg via INTRAVENOUS

## 2022-04-23 MED ORDER — KETOROLAC TROMETHAMINE 30 MG/ML IJ SOLN
30.0000 mg | Freq: Four times a day (QID) | INTRAMUSCULAR | Status: AC | PRN
  Administered 2022-04-24: 30 mg via INTRAMUSCULAR

## 2022-04-23 MED ORDER — DEXMEDETOMIDINE HCL IN NACL 80 MCG/20ML IV SOLN
INTRAVENOUS | Status: DC | PRN
  Administered 2022-04-23: 16 ug via BUCCAL
  Administered 2022-04-23: 8 ug via BUCCAL

## 2022-04-23 MED ORDER — HYDRALAZINE HCL 20 MG/ML IJ SOLN
INTRAMUSCULAR | Status: AC
  Filled 2022-04-23: qty 1

## 2022-04-23 MED ORDER — HYDRALAZINE HCL 20 MG/ML IJ SOLN
5.0000 mg | INTRAMUSCULAR | Status: DC | PRN
  Administered 2022-04-23: 5 mg via INTRAVENOUS

## 2022-04-23 MED ORDER — SUGAMMADEX SODIUM 200 MG/2ML IV SOLN
INTRAVENOUS | Status: DC | PRN
  Administered 2022-04-23: 200 mg via INTRAVENOUS

## 2022-04-23 MED ORDER — FENTANYL CITRATE (PF) 250 MCG/5ML IJ SOLN
INTRAMUSCULAR | Status: AC
  Filled 2022-04-23: qty 5

## 2022-04-23 MED ORDER — ONDANSETRON HCL 4 MG/2ML IJ SOLN: 4.0000 mg | Freq: Once | INTRAMUSCULAR | Status: DC | PRN

## 2022-04-23 MED ORDER — FENTANYL CITRATE (PF) 100 MCG/2ML IJ SOLN
INTRAMUSCULAR | Status: DC | PRN
  Administered 2022-04-23: 100 ug via INTRAVENOUS
  Administered 2022-04-23: 50 ug via INTRAVENOUS
  Administered 2022-04-23: 100 ug via INTRAVENOUS

## 2022-04-23 MED ORDER — ONDANSETRON HCL 4 MG/2ML IJ SOLN
INTRAMUSCULAR | Status: DC | PRN
  Administered 2022-04-23: 4 mg via INTRAVENOUS

## 2022-04-23 MED ORDER — HYDROMORPHONE HCL 1 MG/ML IJ SOLN
INTRAMUSCULAR | Status: AC
  Filled 2022-04-23: qty 0.5

## 2022-04-23 MED ORDER — STERILE WATER FOR IRRIGATION IR SOLN
Status: DC | PRN
  Administered 2022-04-23: 1000 mL

## 2022-04-23 MED ORDER — LABETALOL HCL 200 MG PO TABS
300.0000 mg | ORAL_TABLET | Freq: Three times a day (TID) | ORAL | Status: DC
  Administered 2022-04-24 – 2022-04-25 (×4): 300 mg via ORAL
  Filled 2022-04-23 (×4): qty 1

## 2022-04-23 MED ORDER — SCOPOLAMINE 1 MG/3DAYS TD PT72
1.0000 | MEDICATED_PATCH | Freq: Once | TRANSDERMAL | Status: DC
  Administered 2022-04-24: 1.5 mg via TRANSDERMAL

## 2022-04-23 MED ORDER — KETOROLAC TROMETHAMINE 30 MG/ML IJ SOLN: 30.0000 mg | Freq: Once | INTRAMUSCULAR | Status: AC | PRN

## 2022-04-23 MED ORDER — SODIUM CHLORIDE 0.9% FLUSH: 3.0000 mL | INTRAVENOUS | Status: DC | PRN

## 2022-04-23 MED ORDER — HYDROMORPHONE HCL 1 MG/ML IJ SOLN
0.2500 mg | INTRAMUSCULAR | Status: DC | PRN
  Administered 2022-04-24: 0.5 mg via INTRAVENOUS

## 2022-04-23 MED ORDER — PHENYLEPHRINE HCL (PRESSORS) 10 MG/ML IV SOLN
INTRAVENOUS | Status: DC | PRN
  Administered 2022-04-23 (×2): 80 mg via INTRAVENOUS

## 2022-04-23 MED ORDER — OXYTOCIN-SODIUM CHLORIDE 30-0.9 UT/500ML-% IV SOLN
INTRAVENOUS | Status: AC
  Filled 2022-04-23: qty 500

## 2022-04-23 MED ORDER — OXYTOCIN-SODIUM CHLORIDE 30-0.9 UT/500ML-% IV SOLN
INTRAVENOUS | Status: DC | PRN
  Administered 2022-04-23: 30 [IU] via INTRAVENOUS

## 2022-04-23 MED ORDER — PROPOFOL 10 MG/ML IV BOLUS
INTRAVENOUS | Status: DC | PRN
  Administered 2022-04-23: 200 mg via INTRAVENOUS

## 2022-04-23 MED ORDER — MEPERIDINE HCL 25 MG/ML IJ SOLN: 6.2500 mg | INTRAMUSCULAR | Status: DC | PRN

## 2022-04-23 MED ORDER — KETOROLAC TROMETHAMINE 30 MG/ML IJ SOLN
INTRAMUSCULAR | Status: AC
  Filled 2022-04-23: qty 1

## 2022-04-23 MED ORDER — ACETAMINOPHEN 10 MG/ML IV SOLN
INTRAVENOUS | Status: DC | PRN
  Administered 2022-04-23: 1000 mg via INTRAVENOUS

## 2022-04-23 MED ORDER — MAGNESIUM SULFATE BOLUS VIA INFUSION
6.0000 g | Freq: Once | INTRAVENOUS | Status: AC
  Administered 2022-04-23: 6 g via INTRAVENOUS
  Filled 2022-04-23: qty 1000

## 2022-04-23 MED ORDER — LACTATED RINGERS IV SOLN: INTRAVENOUS | Status: DC

## 2022-04-23 MED ORDER — NALOXONE HCL 0.4 MG/ML IJ SOLN: 0.4000 mg | INTRAMUSCULAR | Status: DC | PRN

## 2022-04-23 MED ORDER — LABETALOL HCL 5 MG/ML IV SOLN: 40.0000 mg | INTRAVENOUS | Status: DC | PRN

## 2022-04-23 MED ORDER — MAGNESIUM SULFATE 40 GM/1000ML IV SOLN
2.0000 g/h | INTRAVENOUS | Status: AC
  Administered 2022-04-23 – 2022-04-24 (×2): 2 g/h via INTRAVENOUS
  Filled 2022-04-23: qty 1000

## 2022-04-23 MED ORDER — SUCCINYLCHOLINE CHLORIDE 200 MG/10ML IV SOSY
PREFILLED_SYRINGE | INTRAVENOUS | Status: DC | PRN
  Administered 2022-04-23: 160 mg via INTRAVENOUS

## 2022-04-23 MED ORDER — HYDROMORPHONE HCL 1 MG/ML IJ SOLN
INTRAMUSCULAR | Status: AC
  Filled 2022-04-23: qty 1

## 2022-04-23 MED ORDER — ONDANSETRON HCL 4 MG/2ML IJ SOLN: 4.0000 mg | Freq: Three times a day (TID) | INTRAMUSCULAR | Status: DC | PRN

## 2022-04-23 MED ORDER — DIPHENHYDRAMINE HCL 25 MG PO CAPS: 25.0000 mg | ORAL_CAPSULE | ORAL | Status: DC | PRN

## 2022-04-23 MED ORDER — LABETALOL HCL 5 MG/ML IV SOLN: 20.0000 mg | INTRAVENOUS | Status: DC | PRN

## 2022-04-23 MED ORDER — NALOXONE HCL 4 MG/10ML IJ SOLN: 1.0000 ug/kg/h | INTRAVENOUS | Status: DC | PRN

## 2022-04-23 MED ORDER — CEFAZOLIN SODIUM-DEXTROSE 2-3 GM-%(50ML) IV SOLR
INTRAVENOUS | Status: DC | PRN
  Administered 2022-04-23: 2 g via INTRAVENOUS

## 2022-04-23 SURGICAL SUPPLY — 27 items
ADH SKN CLS APL DERMABOND .7 (GAUZE/BANDAGES/DRESSINGS) ×2
CHLORAPREP W/TINT 26ML (MISCELLANEOUS) ×4 IMPLANT
CLAMP UMBILICAL CORD (MISCELLANEOUS) ×2 IMPLANT
DERMABOND ADVANCED .7 DNX12 (GAUZE/BANDAGES/DRESSINGS) IMPLANT
DRSG OPSITE POSTOP 4X10 (GAUZE/BANDAGES/DRESSINGS) ×2 IMPLANT
ELECT REM PT RETURN 9FT ADLT (ELECTROSURGICAL) ×1
ELECTRODE REM PT RTRN 9FT ADLT (ELECTROSURGICAL) ×2 IMPLANT
EXTRACTOR VACUUM M CUP 4 TUBE (SUCTIONS) IMPLANT
GLOVE BIOGEL PI IND STRL 6.5 (GLOVE) ×2 IMPLANT
GLOVE BIOGEL PI IND STRL 7.0 (GLOVE) ×2 IMPLANT
GLOVE SURG SS PI 6.5 STRL IVOR (GLOVE) ×2 IMPLANT
GOWN STRL REUS W/TWL LRG LVL3 (GOWN DISPOSABLE) ×4 IMPLANT
KIT ABG SYR 3ML LUER SLIP (SYRINGE) IMPLANT
NDL HYPO 25X5/8 SAFETYGLIDE (NEEDLE) IMPLANT
NEEDLE HYPO 25X5/8 SAFETYGLIDE (NEEDLE) ×2 IMPLANT
NS IRRIG 1000ML POUR BTL (IV SOLUTION) ×2 IMPLANT
PACK C SECTION WH (CUSTOM PROCEDURE TRAY) ×2 IMPLANT
PAD ABD 8X10 STRL (GAUZE/BANDAGES/DRESSINGS) IMPLANT
PAD OB MATERNITY 4.3X12.25 (PERSONAL CARE ITEMS) ×2 IMPLANT
RTRCTR C-SECT PINK 25CM LRG (MISCELLANEOUS) IMPLANT
SUT PLAIN 0 NONE (SUTURE) IMPLANT
SUT PLAIN 2 0 XLH (SUTURE) IMPLANT
SUT VIC AB 0 CT1 36 (SUTURE) ×8 IMPLANT
SUT VIC AB 4-0 KS 27 (SUTURE) ×2 IMPLANT
TOWEL OR 17X24 6PK STRL BLUE (TOWEL DISPOSABLE) ×2 IMPLANT
TRAY FOLEY W/BAG SLVR 14FR LF (SET/KITS/TRAYS/PACK) ×2 IMPLANT
WATER STERILE IRR 1000ML POUR (IV SOLUTION) ×2 IMPLANT

## 2022-04-23 NOTE — Op Note (Signed)
Emily Foster PROCEDURE DATE: 04/23/2022  PREOPERATIVE DIAGNOSIS: Intrauterine pregnancy at  [redacted]w[redacted]d weeks gestation with preeclampsia with severe features; non-reassuring fetal status  POSTOPERATIVE DIAGNOSIS: The same  PROCEDURE:     Cesarean Section  SURGEON:  Dr. Mora Bellman  ASSISTANT: Dr. Trina Ao  An experienced assistant was required given the standard of surgical care given the complexity of the case.  This assistant was needed for exposure, dissection, suctioning, retraction, instrument exchange, assisting with delivery with administration of fundal pressure, and for overall help during the procedure.   INDICATIONS: Emily Needles is a 21 y.o. [phone] at [redacted]w[redacted]d with preeclmapsia with severe features scheduled for cesarean section secondary to non-reassuring fetal status.  The risks of cesarean section discussed with the patient included but were not limited to: bleeding which may require transfusion or reoperation; infection which may require antibiotics; injury to bowel, bladder, ureters or other surrounding organs; injury to the fetus; need for additional procedures including hysterectomy in the event of a life-threatening hemorrhage; placental abnormalities wth subsequent pregnancies, incisional problems, thromboembolic phenomenon and other postoperative/anesthesia complications. The patient concurred with the proposed plan, giving informed written consent for the procedure.    FINDINGS:  Non viable female/ infant in cephalic presentation.  Apgars 0 and 0, with heart rate after 12 minutes of neonatal resuscitation. Clear amniotic fluid.  Partal abruption. Intact placenta, three vessel cord.  Normal uterus, fallopian tubes and ovaries bilaterally.  ANESTHESIA:    General INTRAVENOUS FLUIDS:1500 ml ESTIMATED BLOOD LOSS: 557 ml URINE OUTPUT:  50 ml SPECIMENS: Placenta sent to pathology COMPLICATIONS: None immediate  PROCEDURE IN DETAIL:  The patient received intravenous antibiotics  and had sequential compression devices applied to her lower extremities while in the preoperative area.  She was then taken to the operating room where anesthesia was induced and was found to be adequate. A foley catheter was placed into her bladder and attached to Gervis Gaba gravity. She was then placed in a dorsal supine position with a leftward tilt, and prepped and draped in a sterile manner. After an adequate timeout was performed, a Pfannenstiel skin incision was made with scalpel and carried through to the underlying layer of fascia. The fascia was incised in the midline and this incision was extended bilaterally bluntly. The rectus muscles were separated in the midline bluntly and the peritoneum was entered bluntly. The Alexis self-retaining retractor was introduced into the abdominal cavity. Attention was turned to the lower uterine segment where a transverse hysterotomy was made with a scalpel and extended bilaterally bluntly. The infant was successfully delivered, and cord was clamped and cut and infant was handed over to awaiting neonatology team. A moderate size blood clot followed the delivery of the infant. Uterine massage was then administered and the placenta delivered intact with three-vessel cord. The uterus was cleared of clot and debris.  The hysterotomy was closed with 0 Vicryl in a running locked fashion, and an imbricating layer was also placed with a 0 Vicryl. Overall, excellent hemostasis was noted. The pelvis was cleared of all clot and debris. Hemostasis was confirmed on all surfaces.  The peritoneum and the muscles were reapproximated using 0 vicryl interrupted stitches. The fascia was then closed using 0 Vicryl in a running fashion.  The subcutaneous layer was reapproximated with plain gut and the skin was closed in a subcuticular fashion using 3.0 Vicryl. The patient tolerated the procedure well. Sponge, lap, instrument and needle counts were correct x 2. She was taken to the recovery  room in stable  condition.    Sahasra Belue ConstantMD  04/23/2022 11:15 PM

## 2022-04-23 NOTE — Transfer of Care (Signed)
Immediate Anesthesia Transfer of Care Note  Patient: Emily Foster  Procedure(s) Performed: CESAREAN SECTION  Patient Location: PACU  Anesthesia Type:General  Level of Consciousness: sedated, drowsy and responds to stimulation  Airway & Oxygen Therapy: Patient connected to nasal cannula oxygen  Post-op Assessment: Vital signs stable   Post vital signs: Reviewed and stable  Last Vitals:  Vitals Value Taken Time  BP 141/93 04/23/22 2345  Temp    Pulse 98 04/23/22 2346  Resp 15 04/23/22 2346  SpO2 96 % 04/23/22 2346  Vitals shown include unvalidated device data.  Last Pain:  Vitals:   04/23/22 2140  PainSc: 10-Worst pain ever      Patients Stated Pain Goal: 0 (61/84/85 9276)  Complications: No notable events documented.

## 2022-04-23 NOTE — H&P (Signed)
History     CSN: [id number]  Arrival date and time: 04/23/22 2123   Chief Complaint  Patient presents with   Headache   Back Pain   HPI  Emily Foster is a 21 y.o. G3P0020 at 104w5d who presents for evaluation of a headache and abdominal pain. Patient reports on Friday she noticed she was having increased swelling in her lower legs. On Saturday morning, she woke up with the worst headache she's ever had. She reports she is still having a 10/10 headache. She also reports having upper abdominal pain since Saturday.  She rates the upper abdominal pain a 10/10 and states it is constant.   She reports she came in because the baby hasn't been moving normally today. She states he is normally very active but she hasn't felt him move at all since Saturday  She denies any vaginal bleeding, discharge, and leaking of fluid. Denies any constipation, diarrhea or any urinary complaints.   OB History     Gravida  3   Para  0   Term  0   Preterm  0   AB  2   Living  0      SAB  2   IAB  0   Ectopic  0   Multiple  0   Live Births  0           Past Medical History:  Diagnosis Date   Allergy    Asthma    UTI (urinary tract infection)     Past Surgical History:  Procedure Laterality Date   TOE SURGERY      Family History  Problem Relation Age of Onset   Diabetes Mother    Diabetes Paternal Grandmother    Diabetes Paternal Grandfather     Social History   Tobacco Use   Smoking status: Never   Smokeless tobacco: Never  Vaping Use   Vaping Use: Former  Substance Use Topics   Alcohol use: No   Drug use: No    Allergies:  Allergies  Allergen Reactions   Soy Allergy Itching, Swelling and Rash    Medications Prior to Admission  Medication Sig Dispense Refill Last Dose   acetaminophen (TYLENOL) 500 MG tablet Take 1,000 mg by mouth every 6 (six) hours as needed.   04/23/2022   Prenatal Vit-Fe Fumarate-FA (PREPLUS) 27-1 MG TABS Take 1 tablet by mouth  daily. 30 tablet 13 04/23/2022   albuterol (VENTOLIN HFA) 108 (90 Base) MCG/ACT inhaler TAKE 2 PUFFS BY MOUTH EVERY 6 HOURS AS NEEDED FOR WHEEZE OR SHORTNESS OF BREATH (Patient not taking: Reported on 01/24/2022) 6.7 each 2    Fluticasone Propionate, Inhal, 100 MCG/BLIST AEPB Inhale 1 puff into the lungs 2 (two) times daily. (Patient not taking: Reported on 01/24/2022) 28 each 1    terconazole (TERAZOL 7) 0.4 % vaginal cream Place 1 applicator vaginally at bedtime. 45 g 0     Review of Systems  Constitutional: Negative.  Negative for chills and fever.  Respiratory: Negative.    Cardiovascular: Negative.  Negative for chest pain.  Gastrointestinal:  Positive for abdominal pain.  Genitourinary: Negative.   Neurological:  Positive for headaches. Negative for dizziness.    Physical Exam   Blood pressure (!) 193/111, pulse 79, temperature 98.1 F (36.7 C), resp. rate 18, height 5\' 5"  (1.651 m), weight 93.9 kg, last menstrual period 10/04/2021, SpO2 100 %.  Patient Vitals for the past 24 hrs:  BP Temp Pulse Resp SpO2 Height Weight  04/23/22 2217 (!) 193/111 -- -- -- -- -- --  04/23/22 2215 (!) 193/111 -- 79 -- -- -- --  04/23/22 2200 (!) 193/129 -- 83 -- 100 % -- --  04/23/22 2155 (!) 178/120 -- 69 -- 100 % -- --  04/23/22 2134 (!) 161/121 -- -- -- -- 5\' 5"  (1.651 m) 93.9 kg  04/23/22 2131 -- 98.1 F (36.7 C) 73 18 100 % -- --    Physical Exam Vitals and nursing note reviewed.  Constitutional:      General: She is not in acute distress.    Appearance: She is well-developed.  HENT:     Head: Normocephalic.  Eyes:     Pupils: Pupils are equal, round, and reactive to light.  Cardiovascular:     Rate and Rhythm: Normal rate and regular rhythm.  Pulmonary:     Effort: Pulmonary effort is normal. No respiratory distress.     Breath sounds: Normal breath sounds.  Abdominal:     Palpations: Abdomen is soft.     Tenderness: There is no abdominal tenderness.  Musculoskeletal:         General: Normal range of motion.     Cervical back: Normal range of motion.  Skin:    General: Skin is warm and dry.  Neurological:     Mental Status: She is alert and oriented to person, place, and time.  Psychiatric:        Behavior: Behavior normal.        Thought Content: Thought content normal.        Judgment: Judgment normal.     Fetal Tracing:  Baseline: 150 Variability: none Accels: none Decels: variable and prolonged  Toco: none    MAU Course  Procedures  Results for orders placed or performed during the hospital encounter of 04/23/22 (from the past 24 hour(s))  Urinalysis, Routine w reflex microscopic     Status: Abnormal   Collection Time: 04/23/22 10:08 PM  Result Value Ref Range   Color, Urine YELLOW YELLOW   APPearance HAZY (A) CLEAR   Specific Gravity, Urine 1.027 1.005 - 1.030   pH 6.0 5.0 - 8.0   Glucose, UA NEGATIVE NEGATIVE mg/dL   Hgb urine dipstick NEGATIVE NEGATIVE   Bilirubin Urine NEGATIVE NEGATIVE   Ketones, ur NEGATIVE NEGATIVE mg/dL   Protein, ur >=300 (A) NEGATIVE mg/dL   Nitrite NEGATIVE NEGATIVE   Leukocytes,Ua NEGATIVE NEGATIVE   RBC / HPF 6-10 0 - 5 RBC/hpf   WBC, UA 6-10 0 - 5 WBC/hpf   Bacteria, UA FEW (A) NONE SEEN   Squamous Epithelial / LPF 6-10 0 - 5   Mucus PRESENT      MDM Labs ordered and reviewed.   Upon arrival to MAU, patient found to be profoundly hypertensive.  Preeclampsia orderset CBC, CMP, Protein/creat ratio  2200: Dr. Elly Modena notified of patient arrival and Cat II tracing. MD recommends 578ml fluid bolus while working patient up for preeclampsia  CNM remained at bedside to maintain accurate FHR tracing  2213: Dr. Elly Modena notified of repetitive decelerations. MD en route to bedside  Bedside ultrasound by MD shows vertex presentation  2223: 6g magnesium bolus started and patient turned to left side due to prolonged FHR deceleration   BMZ given  2225: CODE CESAREAN called due to persistent  bradycardia. Patient transported to OR with MD   Assessment and Plan   -Preeclampsia with severe features -Fetal bradycardia -[redacted] weeks gestation  -To OR for delivery  Angelita Ingles  Druscilla Brownie, CNM 04/23/2022, 10:42 PM

## 2022-04-23 NOTE — MAU Note (Signed)
.  Emily Foster is a 21 y.o. at [redacted]w[redacted]d here in MAU reporting swelling on Friday in arms and legs. Saturday was still swollen and had sharp pains in stomach and R hip. Had baby shower and came home and slept afterward. Sunday same symptoms in addition to headache. Pain is in mid to upper abdomen and around to back on both side. Has not felt FM since Sat night.Blurry vision on occ. Denies N/V   Onset of complaint: Friday am Pain score: 10 Vitals:   04/23/22 2131 04/23/22 2134  BP:  (!) 161/121  Pulse: 73   Resp: 18   Temp: 98.1 F (36.7 C)   SpO2: 100%      FHT:125 Lab orders placed from triage:  u/a

## 2022-04-23 NOTE — MAU Note (Signed)
NICU notified of pt's admission and status and FHR strip.

## 2022-04-23 NOTE — Anesthesia Procedure Notes (Signed)
Procedure Name: Intubation Date/Time: 04/23/2022 10:33 PM  Performed by: Genevie Ann, CRNAPre-anesthesia Checklist: Patient identified, Emergency Drugs available, Suction available and Patient being monitored Patient Re-evaluated:Patient Re-evaluated prior to induction Oxygen Delivery Method: Circle system utilized Preoxygenation: Pre-oxygenation with 100% oxygen Induction Type: IV induction and Rapid sequence Laryngoscope Size: Glidescope and 3 Grade View: Grade I Tube type: Oral Tube size: 7.0 mm Number of attempts: 1 Airway Equipment and Method: Stylet Placement Confirmation: ETT inserted through vocal cords under direct vision, positive ETCO2 and breath sounds checked- equal and bilateral Secured at: 22 (at teeth) cm Tube secured with: Tape Dental Injury: Teeth and Oropharynx as per pre-operative assessment

## 2022-04-23 NOTE — Anesthesia Preprocedure Evaluation (Signed)
Anesthesia Evaluation  Patient identified by MRN, date of birth, ID band Patient awake    Reviewed: Allergy & Precautions, NPO status , Patient's Chart, lab work & pertinent test resultsPreop documentation limited or incomplete due to emergent nature of procedure.  Airway Mallampati: II  TM Distance: >3 FB Neck ROM: Full    Dental no notable dental hx.    Pulmonary asthma    Pulmonary exam normal breath sounds clear to auscultation       Cardiovascular negative cardio ROS Normal cardiovascular exam Rhythm:Regular Rate:Normal     Neuro/Psych negative neurological ROS  negative psych ROS   GI/Hepatic negative GI ROS, Neg liver ROS,,,  Endo/Other  negative endocrine ROS    Renal/GU negative Renal ROS  negative genitourinary   Musculoskeletal negative musculoskeletal ROS (+)    Abdominal   Peds negative pediatric ROS (+)  Hematology negative hematology ROS (+)   Anesthesia Other Findings   Reproductive/Obstetrics (+) Pregnancy Code caesarean, presented to MAU for DFM for multiple days- FHR 80s                             Anesthesia Physical Anesthesia Plan  ASA: 3 and emergent  Anesthesia Plan: General   Post-op Pain Management: Toradol IV (intra-op)* and Tylenol PO (pre-op)*   Induction: Intravenous  PONV Risk Score and Plan: 4 or greater and Ondansetron, Dexamethasone, Treatment may vary due to age or medical condition and Midazolam  Airway Management Planned: Oral ETT and Video Laryngoscope Planned  Additional Equipment: None  Intra-op Plan:   Post-operative Plan: Extubation in OR  Informed Consent: I have reviewed the patients History and Physical, chart, labs and discussed the procedure including the risks, benefits and alternatives for the proposed anesthesia with the patient or authorized representative who has indicated his/her understanding and acceptance.     Dental  advisory given and Only emergency history available  Plan Discussed with: CRNA  Anesthesia Plan Comments: (Code caesarean )       Anesthesia Quick Evaluation

## 2022-04-24 ENCOUNTER — Encounter (HOSPITAL_COMMUNITY): Payer: Self-pay | Admitting: Obstetrics and Gynecology

## 2022-04-24 DIAGNOSIS — O4593 Premature separation of placenta, unspecified, third trimester: Secondary | ICD-10-CM

## 2022-04-24 DIAGNOSIS — Z98891 History of uterine scar from previous surgery: Secondary | ICD-10-CM

## 2022-04-24 DIAGNOSIS — O1410 Severe pre-eclampsia, unspecified trimester: Secondary | ICD-10-CM

## 2022-04-24 LAB — DIC (DISSEMINATED INTRAVASCULAR COAGULATION)PANEL
D-Dimer, Quant: 8.1 ug/mL-FEU — ABNORMAL HIGH (ref 0.00–0.50)
Fibrinogen: 369 mg/dL (ref 210–475)
INR: 1 (ref 0.8–1.2)
Platelets: 195 10*3/uL (ref 150–400)
Prothrombin Time: 13 seconds (ref 11.4–15.2)
Smear Review: NONE SEEN
aPTT: 27 seconds (ref 24–36)

## 2022-04-24 LAB — CBC
HCT: 36.4 % (ref 36.0–46.0)
Hemoglobin: 12 g/dL (ref 12.0–15.0)
MCH: 28.8 pg (ref 26.0–34.0)
MCHC: 33 g/dL (ref 30.0–36.0)
MCV: 87.5 fL (ref 80.0–100.0)
Platelets: 196 10*3/uL (ref 150–400)
RBC: 4.16 MIL/uL (ref 3.87–5.11)
RDW: 13 % (ref 11.5–15.5)
WBC: 25.7 10*3/uL — ABNORMAL HIGH (ref 4.0–10.5)
nRBC: 0 % (ref 0.0–0.2)

## 2022-04-24 LAB — COMPREHENSIVE METABOLIC PANEL
ALT: 19 U/L (ref 0–44)
AST: 31 U/L (ref 15–41)
Albumin: 2.3 g/dL — ABNORMAL LOW (ref 3.5–5.0)
Alkaline Phosphatase: 92 U/L (ref 38–126)
Anion gap: 12 (ref 5–15)
BUN: 9 mg/dL (ref 6–20)
CO2: 17 mmol/L — ABNORMAL LOW (ref 22–32)
Calcium: 7.9 mg/dL — ABNORMAL LOW (ref 8.9–10.3)
Chloride: 107 mmol/L (ref 98–111)
Creatinine, Ser: 0.83 mg/dL (ref 0.44–1.00)
GFR, Estimated: >60 mL/min (ref >60)
Glucose, Bld: 143 mg/dL — ABNORMAL HIGH (ref 70–99)
Potassium: 4.1 mmol/L (ref 3.5–5.1)
Sodium: 136 mmol/L (ref 135–145)
Total Bilirubin: 0.4 mg/dL (ref 0.3–1.2)
Total Protein: 5.4 g/dL — ABNORMAL LOW (ref 6.5–8.1)

## 2022-04-24 MED ORDER — HYDROMORPHONE HCL 1 MG/ML IJ SOLN
INTRAMUSCULAR | Status: AC
  Filled 2022-04-24: qty 1

## 2022-04-24 MED ORDER — WITCH HAZEL-GLYCERIN EX PADS: 1.0000 | MEDICATED_PAD | CUTANEOUS | Status: DC | PRN

## 2022-04-24 MED ORDER — MIDAZOLAM HCL 2 MG/2ML IJ SOLN
INTRAMUSCULAR | Status: AC
  Filled 2022-04-24: qty 2

## 2022-04-24 MED ORDER — NIFEDIPINE ER OSMOTIC RELEASE 30 MG PO TB24: 30.0000 mg | ORAL_TABLET | Freq: Every day | ORAL | Status: DC

## 2022-04-24 MED ORDER — SODIUM CHLORIDE 0.9% FLUSH: 9.0000 mL | INTRAVENOUS | Status: DC | PRN

## 2022-04-24 MED ORDER — HYDROMORPHONE 1 MG/ML IV SOLN
INTRAVENOUS | Status: DC
  Administered 2022-04-24: 1.1 mL via INTRAVENOUS
  Filled 2022-04-24 (×3): qty 30

## 2022-04-24 MED ORDER — NALOXONE HCL 0.4 MG/ML IJ SOLN: 0.4000 mg | INTRAMUSCULAR | Status: DC | PRN

## 2022-04-24 MED ORDER — TETANUS-DIPHTH-ACELL PERTUSSIS 5-2.5-18.5 LF-MCG/0.5 IM SUSY: 0.5000 mL | PREFILLED_SYRINGE | Freq: Once | INTRAMUSCULAR | Status: DC

## 2022-04-24 MED ORDER — OXYCODONE-ACETAMINOPHEN 5-325 MG PO TABS: 1.0000 | ORAL_TABLET | Freq: Four times a day (QID) | ORAL | Status: DC | PRN

## 2022-04-24 MED ORDER — SENNOSIDES-DOCUSATE SODIUM 8.6-50 MG PO TABS
2.0000 | ORAL_TABLET | Freq: Every day | ORAL | Status: DC
  Administered 2022-04-24 – 2022-04-25 (×2): 2 via ORAL
  Filled 2022-04-24 (×2): qty 2

## 2022-04-24 MED ORDER — SIMETHICONE 80 MG PO CHEW: 80.0000 mg | CHEWABLE_TABLET | ORAL | Status: DC | PRN

## 2022-04-24 MED ORDER — DIPHENHYDRAMINE HCL 50 MG/ML IJ SOLN: 12.5000 mg | Freq: Four times a day (QID) | INTRAMUSCULAR | Status: DC | PRN

## 2022-04-24 MED ORDER — ONDANSETRON HCL 4 MG/2ML IJ SOLN: 4.0000 mg | Freq: Four times a day (QID) | INTRAMUSCULAR | Status: DC | PRN

## 2022-04-24 MED ORDER — OXYTOCIN-SODIUM CHLORIDE 30-0.9 UT/500ML-% IV SOLN: 2.5000 [IU]/h | INTRAVENOUS | Status: AC

## 2022-04-24 MED ORDER — IBUPROFEN 600 MG PO TABS
600.0000 mg | ORAL_TABLET | Freq: Four times a day (QID) | ORAL | Status: DC
  Administered 2022-04-24 – 2022-04-25 (×5): 600 mg via ORAL
  Filled 2022-04-24 (×5): qty 1

## 2022-04-24 MED ORDER — DIPHENHYDRAMINE HCL 12.5 MG/5ML PO ELIX: 12.5000 mg | ORAL_SOLUTION | Freq: Four times a day (QID) | ORAL | Status: DC | PRN

## 2022-04-24 MED ORDER — FUROSEMIDE 20 MG PO TABS
20.0000 mg | ORAL_TABLET | Freq: Every day | ORAL | Status: DC
  Administered 2022-04-24 – 2022-04-25 (×2): 20 mg via ORAL
  Filled 2022-04-24 (×2): qty 1

## 2022-04-24 MED ORDER — AMLODIPINE BESYLATE 5 MG PO TABS
10.0000 mg | ORAL_TABLET | Freq: Every day | ORAL | Status: DC
  Administered 2022-04-24 – 2022-04-25 (×2): 10 mg via ORAL
  Filled 2022-04-24 (×2): qty 2

## 2022-04-24 MED ORDER — COCONUT OIL OIL
1.0000 | TOPICAL_OIL | Status: DC | PRN
  Administered 2022-04-24: 1 via TOPICAL

## 2022-04-24 MED ORDER — MENTHOL 3 MG MT LOZG: 1.0000 | LOZENGE | OROMUCOSAL | Status: DC | PRN

## 2022-04-24 MED ORDER — ENALAPRIL MALEATE 2.5 MG PO TABS: 10.0000 mg | ORAL_TABLET | Freq: Every day | ORAL | Status: DC

## 2022-04-24 MED ORDER — DIBUCAINE (PERIANAL) 1 % EX OINT: 1.0000 | TOPICAL_OINTMENT | CUTANEOUS | Status: DC | PRN

## 2022-04-24 MED ORDER — ENOXAPARIN SODIUM 40 MG/0.4ML IJ SOSY
40.0000 mg | PREFILLED_SYRINGE | INTRAMUSCULAR | Status: DC
  Administered 2022-04-24: 40 mg via SUBCUTANEOUS
  Filled 2022-04-24: qty 0.4

## 2022-04-24 MED ORDER — SIMETHICONE 80 MG PO CHEW
80.0000 mg | CHEWABLE_TABLET | Freq: Three times a day (TID) | ORAL | Status: DC
  Administered 2022-04-24 – 2022-04-25 (×4): 80 mg via ORAL
  Filled 2022-04-24 (×4): qty 1

## 2022-04-24 MED ORDER — DIPHENHYDRAMINE HCL 25 MG PO CAPS
25.0000 mg | ORAL_CAPSULE | Freq: Four times a day (QID) | ORAL | Status: DC | PRN
  Administered 2022-04-24: 25 mg via ORAL
  Filled 2022-04-24: qty 1

## 2022-04-24 NOTE — MAU Note (Signed)
Code Caesarean called to PBX operator

## 2022-04-24 NOTE — Consult Note (Signed)
Neonatology Note:   Attendance at CODE C-section:    I was asked by Dr. Elly Modena to attend this CODE C/S delivery at [redacted]w[redacted]d for bradycardia. The mother is a [id number] who is GBS unk with good prenatal care complicated by profound HTN upon presentation to MAU today and decreased fetal movement since 10/21.  ROM 0h 102m prior to delivery, fluid clear. Infant not vigorous nor with good spontaneous cry and tone. Cord immediately cut and baby brought to ISR and placed on warming mattress.  Not responsive, no HR palpable.  SAo2 placed and PPV initiated.  No response, readjusted and continued with no response thus immediately intubated on first attempt at 79minute of life.  No HR.  Continue PPV with gradually higher pressure plus  compressions and gave epi via ETT at 2 minutes of life.  No response in HR.  Continued resuscitation incl chest compressions.  Emergent UVC placed by 4-5 minutes of life once ETT secured with first dose of Epi via line given (no HR response) followed by 10cc NS.  Pressures increased. Epi repeat dose given via line at 8 min of life with another 10cc NS bolus with extreme bradycardia now noted.  3rd dose of Epi given at 40min with subsequent gradual improvement in HR to 100 over the next couple minutes though Sao2 remined low in the 40s.  Surfactant given without issues at 61min of life with Sao2 increases to upper 90s over a couple minutes.  HR now >100.    Apgars 0 at 1 minute, 0 at 5 minutes, and 1 at 10 minutes.  Mother under general anesthesia; father en route to hospital.  To NICU for further management.   Monia Sabal Katherina Mires, MD Neonatologist 04/24/2022, 1:01 AM

## 2022-04-24 NOTE — Anesthesia Postprocedure Evaluation (Signed)
Anesthesia Post Note  Patient: Emily Foster  Procedure(s) Performed: [facility]     Patient location during evaluation: PACU Anesthesia Type: General Level of consciousness: awake and alert, oriented and patient cooperative Pain management: pain level controlled Vital Signs Assessment: post-procedure vital signs reviewed and stable Respiratory status: spontaneous breathing, nonlabored ventilation and respiratory function stable Cardiovascular status: blood pressure returned to baseline and stable Postop Assessment: no apparent nausea or vomiting Anesthetic complications: no   No notable events documented.  Last Vitals:  Vitals:   04/24/22 0022 04/24/22 0030  BP: (!) 143/94   Pulse: (!) 120 92  Resp: 14 18  Temp:  (!) 35.8 C  SpO2: 99% 97%    Last Pain:  Vitals:   04/24/22 0030  PainSc: Asleep   Pain Goal: Patients Stated Pain Goal: 0 (04/23/22 2140)  LLE Motor Response: Purposeful movement (04/24/22 0030) LLE Sensation: Full sensation (04/24/22 0030) RLE Motor Response: Purposeful movement (04/24/22 0030) RLE Sensation: Full sensation (04/24/22 0030)     Epidural/Spinal Function Cutaneous sensation: Normal sensation (04/24/22 0022), Patient able to flex knees: Yes (04/24/22 0022), Patient able to lift hips off bed: Yes (04/24/22 0022), Back pain beyond tenderness at insertion site: No (04/24/22 0022), Progressively worsening motor and/or sensory loss: No (04/24/22 0022), Bowel and/or bladder incontinence post epidural: No (04/24/22 0022)  Pervis Hocking

## 2022-04-24 NOTE — Lactation Note (Signed)
This note was copied from a baby'Emily Foster chart.  NICU Lactation Consultation Note  Patient Name: Emily Foster Today'Emily Foster Date: 04/24/2022 Age:22 hours  Subjective Reason for consult: Initial assessment; Primapara; 1st time breastfeeding; NICU baby; Preterm <34wks; Infant < 6lbs  Visited with family of 48 hours old pre-term NICU female, Emily Foster is a P1 and reports she would like to be breastfeeding a try. Assisted with hand expression (no colostrum noted yet), set her up with a DEBP and initiated pumping during [facility] consultation. Reviewed pumping schedule, pump settings, lactogenesis II, benefits of premature milk and anticipatory guidelines. [name], she was on a telephone call during [facility] consultation.   Objective Infant data: Mother'Emily Foster Current Feeding Choice: Breast Milk and Donor Milk  Maternal data: [id number]  C-Section, Low Transverse Significant Breast History:: (+) breast changes during the pregnancy Current breast feeding challenges:: NICU admission Does the patient have breastfeeding experience prior to this delivery?: No Pumping frequency: q 3 hours Flange Size: 24 Risk factor for low milk supply:: prematurity, primipara, infant separation WIC Program: No WIC Referral Sent?: Yes [place]  Assessment Infant: Feeding Status: NPO  Maternal: Breasts are soft but noticeable areolar edema (++) tissue is semi-compressible  Intervention/Plan Interventions: Breast feeding basics reviewed; Breast massage; Hand express; DEBP; Education; [employer] brochure; Coconut oil Tools: Pump; Flanges; Coconut oil Pump Education: Setup, frequency, and cleaning; Milk Storage  Plan of care: Encouraged pumping every 3 hours, ideally 8 times/24 hours Breast massage, hand expression and coconut oil were also encouraged prior pumping  No other support person at this time. All questions and concerns answered, family to contact [facility]  services PRN.  Consult Status: NICU follow-up  NICU Follow-up type: New admission follow up; Maternal D/C visit; Verify onset of copious milk; Verify absence of engorgement   Emily Foster Emily Foster Emily Foster 04/24/2022, 12:09 PM

## 2022-04-24 NOTE — Progress Notes (Signed)
Subjective: Postpartum Day 1: Cesarean Delivery Patient reports feeling well. She denies HA, visual changes, RUQ/epigastric pain. She admits to generalized pruritus.  Foley catheter was found in the bed deflated.  Objective: Vital signs in last 24 hours: Temp:  [96.4 F (35.8 C)-98.1 F (36.7 C)] 96.4 F (35.8 C) (10/25 0030) Pulse Rate:  [69-122] 83 (10/25 0606) Resp:  [14-22] 18 (10/25 0700) BP: (129-193)/(89-129) 146/96 (10/25 0606) SpO2:  [96 %-100 %] 100 % (10/25 0400) Weight:  [93.9 kg] 93.9 kg (10/24 2134)  Physical Exam:  General: alert, cooperative, and no distress Lochia: appropriate Uterine Fundus: firm Incision: honeycomb dressing clean and dry DVT Evaluation: No evidence of DVT seen on physical exam.  Recent Labs    04/23/22 2240 04/24/22 0206  HGB 11.5* 12.0  HCT 33.8* 36.4    Assessment/Plan: Status post Cesarean section. Doing well postoperatively.  Continue magnesium sulfate for seizure prophylaxis for a total of 24 hours Will transition to PO medication and discontinue PCA Continue strict I&O Procardia 30 mg started for HTN control  Mora Bellman, MD 04/24/2022, 7:37 AM

## 2022-04-24 NOTE — Plan of Care (Signed)
  Problem: Education: Goal: Knowledge of condition will improve Outcome: Progressing   

## 2022-04-24 NOTE — Discharge Summary (Signed)
Postpartum Discharge Summary  Date of Service updated***     Patient Name: Emily Foster DOB: Sep 10, 2000 MRN: [id number]  Date of admission: 04/23/2022 Delivery date:04/23/2022  Delivering provider: CONSTANT, [place]  Date of discharge: 04/24/2022  Admitting diagnosis: S/P cesarean section [Z98.891] Intrauterine pregnancy: [redacted]w[redacted]d    Secondary diagnosis:  Principal Problem:   S/P cesarean section Active Problems:   Preterm delivery   Pre-eclampsia, severe   Abruptio placenta, third trimester  Additional problems: ***    Discharge diagnosis: Preterm Pregnancy Delivered and Preeclampsia (severe)                                              Post partum procedures:{Postpartum procedures:23558} Augmentation: N/A Complications: Placental Abruption, non-viable newborn that was successfully resuscitated  Hospital course: STAT C-section   21y.o. yo G3P0121 at 215w5das admitted to the hospital 04/23/2022 for a STAT cesarean section with the following indication:Non-Reassuring FHR and pre-eclampsia with severe features, persistent fetal bradycardia .Delivery details are as follows:  Membrane Rupture Time/Date: 10:37 PM ,04/23/2022   Delivery Method:C-Section, Low Transverse  Details of operation can be found in separate operative note.  Patient had a postpartum course complicated by***.  She is ambulating, tolerating a regular diet, passing flatus, and urinating well. Patient is discharged home in stable condition on  04/24/22        Newborn Data: Birth date:04/23/2022  Birth time:10:38 PM  Gender:Female  Living status:Living  Apgars:0 ,0  Weight:980 g     Magnesium Sulfate received: Yes: Seizure prophylaxis BMZ received: Yes. 1 dose Rhophylac:N/A MMR:{MMR:30440033} T-DaP:{Tdap:23962} Flu: {F{TXM:46803}ransfusion:{Transfusion received:30440034}  Physical exam  Vitals:   04/24/22 0010 04/24/22 0015 04/24/22 0022 04/24/22 0030  BP:  (!) 142/89 (!) 143/94   Pulse: (!) 122 (!)  113 (!) 120 92  Resp: _0 Temp:    (!) 96.4 F (35.8 C)  SpO2: 96% 98% 99% 97%  Weight:      Height:       General: {Exam; general:21111117} Lochia: {Desc; appropriate/inappropriate:30686::"appropriate"} Uterine Fundus: {Desc; firm/soft:30687} Incision: {Exam; incision:21111123} DVT Evaluation: {Exam; dvt:2111122} Labs: Lab Results  Component Value Date   WBC 12.2 (H) 04/23/2022   HGB 11.5 (L) 04/23/2022   HCT 33.8 (L) 04/23/2022   MCV 86.2 04/23/2022   PLT 165 04/23/2022      Latest Ref Rng & Units 04/23/2022   10:08 PM  CMP  Glucose 70 - 99 mg/dL 90   BUN 6 - 20 mg/dL 8   Creatinine 0.44 - 1.00 mg/dL 0.77   Sodium 135 - 145 mmol/L 139   Potassium 3.5 - 5.1 mmol/L 3.8   Chloride 98 - 111 mmol/L 110   CO2 22 - 32 mmol/L 23   Calcium 8.9 - 10.3 mg/dL 8.2   Total Protein 6.5 - 8.1 g/dL 5.3   Total Bilirubin 0.3 - 1.2 mg/dL 0.4   Alkaline Phos 38 - 126 U/L 94   AST 15 - 41 U/L 24   ALT 0 - 44 U/L 18    Edinburgh Score:     No data to display           After visit meds:  Allergies as of 04/24/2022       Reactions   Soy Allergy Itching, Swelling, Rash     Med Rec must be completed prior to  using this [place]***        Discharge home in stable condition Infant Feeding: {Baby feeding:23562} Infant Disposition:{CHL IP OB HOME WITH OQHUTM:54650} Discharge instruction: per After Visit Summary and Postpartum booklet. Activity: Advance as tolerated. Pelvic rest for 6 weeks.  Diet: {OB PTWS:56812751} Future Appointments: Future Appointments  Date Time Provider [employer]  04/30/2022  8:15 AM Seabron Spates, CNM [facility] None  05/07/2022  1:15 PM [facility] NURSE [facility]  05/07/2022  1:30 PM [facility] US3 [facility] [place]   Follow up Visit:   Please schedule this patient for a In person postpartum visit in 4 weeks with the following provider: MD. Additional Postpartum F/U:Incision check 1 week and BP check 1 week  High risk pregnancy  complicated by:  pre-eclampsia with severe features, placenta abruption, c-section at [redacted] weeks gestation Delivery mode:  C-Section, Low Transverse  Anticipated Birth Control:  Unsure   04/24/2022 Stormy Card, MD

## 2022-04-25 ENCOUNTER — Other Ambulatory Visit (HOSPITAL_COMMUNITY): Payer: Self-pay

## 2022-04-25 LAB — SURGICAL PATHOLOGY

## 2022-04-25 MED ORDER — ACETAMINOPHEN 500 MG PO TABS
500.0000 mg | ORAL_TABLET | Freq: Four times a day (QID) | ORAL | 0 refills | Status: DC | PRN
  Filled 2022-04-25: qty 30, 8d supply, fill #0

## 2022-04-25 MED ORDER — FUROSEMIDE 20 MG PO TABS
20.0000 mg | ORAL_TABLET | Freq: Every day | ORAL | 0 refills | Status: DC
  Filled 2022-04-25: qty 3, 3d supply, fill #0

## 2022-04-25 MED ORDER — OXYCODONE-ACETAMINOPHEN 5-325 MG PO TABS
1.0000 | ORAL_TABLET | Freq: Four times a day (QID) | ORAL | 0 refills | Status: DC | PRN
  Filled 2022-04-25: qty 30, 4d supply, fill #0

## 2022-04-25 MED ORDER — AMLODIPINE BESYLATE 10 MG PO TABS
10.0000 mg | ORAL_TABLET | Freq: Every day | ORAL | 1 refills | Status: DC
  Filled 2022-04-25: qty 30, 30d supply, fill #0
  Filled 2022-05-16 – 2022-05-25 (×2): qty 30, 30d supply, fill #1

## 2022-04-25 MED ORDER — IBUPROFEN 600 MG PO TABS
600.0000 mg | ORAL_TABLET | Freq: Four times a day (QID) | ORAL | 0 refills | Status: DC
  Filled 2022-04-25: qty 30, 8d supply, fill #0

## 2022-04-25 MED ORDER — LABETALOL HCL 300 MG PO TABS
300.0000 mg | ORAL_TABLET | Freq: Three times a day (TID) | ORAL | 1 refills | Status: DC
  Filled 2022-04-25: qty 60, 20d supply, fill #0
  Filled 2022-05-13 – 2022-05-25 (×2): qty 60, 20d supply, fill #1

## 2022-04-25 NOTE — Lactation Note (Signed)
This note was copied from a baby's chart.  NICU Lactation Consultation Note  Patient Name: Boy Niger Bezio QASTM'H Date: 04/25/2022 Age:22 hours   Subjective Reason for consult: Follow-up assessment; Maternal discharge Pt is pumping frequently and using HE to increase output. We reviewed pumping strategies, milk storage, and IDF when infant is ready. Pt plans to d/c today. She is aware of need to connect with [facility] for receipt of pump and plans to remain in NICU for pumping until she has a pump to use at home.   Pt denies pain or difficulty pumping. She denies breast fullness or signs of copious milk today. We reviewed timing of expected breast changes and volume increase. We also reviewed s/s of engorgement and strategies to treat/prevent.   Objective Infant data: Mother's Current Feeding Choice: Breast Milk and Donor Milk   Maternal data: [id number]  C-Section, Low Transverse Significant Breast History:: (+) breast changes during the pregnancy  Current breast feeding challenges:: NICU admission  Does the patient have breastfeeding experience prior to this delivery?: No  Pumping frequency: q3h with drops today as expected Flange Size: 24  Risk factor for low milk supply:: prematurity, primipara, infant separation   WIC Program: No WIC Referral Sent?: Yes  Assessment Infant: Feeding Status: NPO   Maternal: Milk volume: Normal   Intervention/Plan Interventions: Education; Infant Driven Feeding Algorithm education  Tools: Pump; Flanges; Coconut oil Pump Education: Setup, frequency, and cleaning; Milk Storage  Plan: Consult Status: NICU follow-up  NICU Follow-up type: Verify absence of engorgement; Verify onset of copious milk; Weekly NICU follow up  Pt to f/u with [facility] for at-home pump. Pt to continue pumping q3h and bring any EBM to NICU. Pt to use HE prn to increase milk output.  Gwynne Edinger 04/25/2022, 9:31 AM

## 2022-04-25 NOTE — Plan of Care (Signed)
  Problem: Education: Goal: Knowledge of disease or condition will improve Outcome: Adequate for Discharge Goal: Knowledge of the prescribed therapeutic regimen will improve Outcome: Adequate for Discharge   Problem: Fluid Volume: Goal: Peripheral tissue perfusion will improve Outcome: Adequate for Discharge   Problem: Clinical Measurements: Goal: Complications related to disease process, condition or treatment will be avoided or minimized Outcome: Adequate for Discharge   Problem: Education: Goal: Knowledge of General Education information will improve Description: Including pain rating scale, medication(s)/side effects and non-pharmacologic comfort measures Outcome: Adequate for Discharge   Problem: Health Behavior/Discharge Planning: Goal: Ability to manage health-related needs will improve Outcome: Adequate for Discharge   Problem: Clinical Measurements: Goal: Ability to maintain clinical measurements within normal limits will improve Outcome: Adequate for Discharge Goal: Will remain free from infection Outcome: Adequate for Discharge Goal: Diagnostic test results will improve Outcome: Adequate for Discharge Goal: Respiratory complications will improve Outcome: Adequate for Discharge Goal: Cardiovascular complication will be avoided Outcome: Adequate for Discharge   Problem: Activity: Goal: Risk for activity intolerance will decrease Outcome: Adequate for Discharge   Problem: Nutrition: Goal: Adequate nutrition will be maintained Outcome: Adequate for Discharge   Problem: Coping: Goal: Level of anxiety will decrease Outcome: Adequate for Discharge   Problem: Elimination: Goal: Will not experience complications related to bowel motility Outcome: Adequate for Discharge Goal: Will not experience complications related to urinary retention Outcome: Adequate for Discharge   Problem: Pain Managment: Goal: General experience of comfort will improve Outcome: Adequate  for Discharge   Problem: Safety: Goal: Ability to remain free from injury will improve Outcome: Adequate for Discharge   Problem: Skin Integrity: Goal: Risk for impaired skin integrity will decrease Outcome: Adequate for Discharge   Problem: Education: Goal: Knowledge of the prescribed therapeutic regimen will improve Outcome: Adequate for Discharge Goal: Understanding of sexual limitations or changes related to disease process or condition will improve Outcome: Adequate for Discharge Goal: Individualized Educational Video(s) Outcome: Adequate for Discharge   Problem: Self-Concept: Goal: Communication of feelings regarding changes in body function or appearance will improve Outcome: Adequate for Discharge   Problem: Skin Integrity: Goal: Demonstration of wound healing without infection will improve Outcome: Adequate for Discharge   Problem: Education: Goal: Knowledge of condition will improve Outcome: Adequate for Discharge Goal: Individualized Educational Video(s) Outcome: Adequate for Discharge Goal: Individualized Newborn Educational Video(s) Outcome: Adequate for Discharge   Problem: Activity: Goal: Will verbalize the importance of balancing activity with adequate rest periods Outcome: Adequate for Discharge Goal: Ability to tolerate increased activity will improve Outcome: Adequate for Discharge   Problem: Coping: Goal: Ability to identify and utilize available resources and services will improve Outcome: Adequate for Discharge   Problem: Life Cycle: Goal: Chance of risk for complications during the postpartum period will decrease Outcome: Adequate for Discharge   Problem: Role Relationship: Goal: Ability to demonstrate positive interaction with newborn will improve Outcome: Adequate for Discharge   Problem: Skin Integrity: Goal: Demonstration of wound healing without infection will improve Outcome: Adequate for Discharge   

## 2022-04-26 ENCOUNTER — Ambulatory Visit: Payer: Self-pay

## 2022-04-26 NOTE — Lactation Note (Signed)
This note was copied from a baby's chart.  NICU Lactation Consultation Note  Patient Name: Emily Foster NKNLZ'J Date: 04/26/2022 Age:21 years   Subjective Reason for consult: Follow-up assessment Mother continues to pump frequently and is experiencing +breast changes today. She has a [facility] appointment at 1pm for pump pickup.   Objective Maternal data: [id number]  C-Section, Low Transverse  Pumping frequency: q3-4h Pumped volume: 30 mL   WIC Program: No WIC Referral Sent?: Yes  Assessment Infant: Feeding Status: NPO   Maternal: Milk volume: Normal   Intervention/Plan Interventions: Education  Plan: Consult Status: NICU follow-up  NICU Follow-up type: Verify absence of engorgement; Weekly NICU follow up  Continue to pump q3h and bring EBM to NICU  Gwynne Edinger 04/26/2022, 12:16 PM

## 2022-04-30 ENCOUNTER — Ambulatory Visit (INDEPENDENT_AMBULATORY_CARE_PROVIDER_SITE_OTHER): Payer: BC Managed Care – PPO | Admitting: Advanced Practice Midwife

## 2022-04-30 ENCOUNTER — Encounter: Payer: Self-pay | Admitting: Advanced Practice Midwife

## 2022-04-30 VITALS — BP 128/81 | HR 80

## 2022-04-30 DIAGNOSIS — O1413 Severe pre-eclampsia, third trimester: Secondary | ICD-10-CM

## 2022-04-30 DIAGNOSIS — Z8759 Personal history of other complications of pregnancy, childbirth and the puerperium: Secondary | ICD-10-CM

## 2022-04-30 DIAGNOSIS — O1415 Severe pre-eclampsia, complicating the puerperium: Secondary | ICD-10-CM

## 2022-04-30 NOTE — Progress Notes (Signed)
   Subjective:    Patient ID: Emily Foster, female    DOB: 12-30-2000, 21 y.o.   MRN: [id number]  Hypertension This is a recurrent problem. The problem has been gradually improving since onset. Associated symptoms include malaise/fatigue and peripheral edema. Pertinent negatives include no anxiety, blurred vision, chest pain, headaches or shortness of breath. Treatments tried: Labetalol and Amlodipine.   This is a 21 y.o. female who is one week post Cesarean Delivery for severe preeclampsia and NRFHR with partial abruption.  Baby had 0/0/0 apgars but returned with vital signs after 10 min.  He is doing much better in NICU now, weaned to 31% oxygen.  States is opening eyes and apparently does not have major deficits for now.   She is very tired and sore.  Is taking ibuprofen and tylenol   Not taking Percocet. Took 3 days of Lasix   On Labetalol tid and amlodipine. No headaches, still has some LE swelling.    Review of Systems  Constitutional:  Positive for malaise/fatigue.  Eyes:  Negative for blurred vision.  Respiratory:  Negative for shortness of breath.   Cardiovascular:  Negative for chest pain.  Neurological:  Negative for headaches.       Objective:   Physical Exam Constitutional:      General: She is not in acute distress.    Appearance: She is not ill-appearing or toxic-appearing.  HENT:     Head: Normocephalic.  Cardiovascular:     Rate and Rhythm: Normal rate.  Pulmonary:     Effort: Pulmonary effort is normal.  Abdominal:     General: There is no distension.     Tenderness: There is abdominal tenderness (over incision). There is no guarding.     Hernia: No hernia is present.  Musculoskeletal:        General: Normal range of motion.     Cervical back: Normal range of motion.     Left lower leg: Edema present.  Skin:    General: Skin is warm and dry.  Neurological:     General: No focal deficit present.     Mental Status: She is alert.  Psychiatric:        Mood  and Affect: Mood normal.            Assessment & Plan:  A;  Postpartum x 1 week        Post C/S delivery      NICU infant       Severe preeclampsia  P:   Discussed with Dr Rip Harbour       Will reduce Labetalol from TID to BID and recheck BP next week        Support offered        Preeclampsia signs reviewed         Seabron Spates, CNM

## 2022-04-30 NOTE — Progress Notes (Signed)
Patient is one week post c-section.

## 2022-05-01 ENCOUNTER — Other Ambulatory Visit: Payer: Self-pay | Admitting: Obstetrics and Gynecology

## 2022-05-04 ENCOUNTER — Ambulatory Visit: Payer: Self-pay

## 2022-05-04 NOTE — Lactation Note (Signed)
This note was copied from a baby's chart.  NICU Lactation Consultation Note  Patient Name: Emily Foster Today's Date: 05/04/2022 Age:22 days  Subjective Reason for consult: Follow-up assessment; 1st time breastfeeding; Primapara; NICU baby; Preterm <34wks; Infant < 6lbs  Visited with family of 64 26/1 weeks old (adjusted) NICU female, Emily Foster is a P1 and reports that pumping is going well and her supply continues to increase; no S/S of engorgement at this time. Noticed that she's not pumping consistently. Revised pumping goals and let her know that the best outcome for an exclusive breastmilk diet at discharge will come with consistent pumping at least or as close to 8 pumping sessions/24 hours. Reviewed pumping schedule, lactogenesis III, benefits of STS care once baby is ready and anticipatory guidelines.   Objective Infant data: Mother's Current Feeding Choice: Breast Milk  Infant feeding assessment Scheduled gavage feedings  Maternal data: [id number]  C-Section, Low Transverse Pumping frequency: 4 times/24 hours Pumped volume: 120 mL Flange Size: 24 WIC Program: No WIC Referral Sent?: Yes Pump: Personal (WIC pump)  Assessment Infant: In NICU  Maternal: Milk volume: Normal  Intervention/Plan Interventions: Breast feeding basics reviewed; DEBP; Education Tools: Pump; Flanges; Coconut oil Pump Education: Setup, frequency, and cleaning; Milk Storage  Plan of care: Encouraged pumping every 3 hours, ideally 8 times/24 hours or the closest she can get to it Parent will start practicing STS care once baby is ready   FOB present and supportive. All questions and concerns answered, family to contact [facility] services PRN.  Consult Status: NICU follow-up  NICU Follow-up type: Weekly NICU follow up   [place] 05/04/2022, 3:22 PM

## 2022-05-07 ENCOUNTER — Ambulatory Visit: Payer: BC Managed Care – PPO

## 2022-05-07 ENCOUNTER — Other Ambulatory Visit: Payer: BC Managed Care – PPO

## 2022-05-07 NOTE — Progress Notes (Signed)
Patient presents for BP check. Patient delivered on 04-23-22 at 28.5 weeks - c-section for Preeclampsia with severe Features.  Patient reports her incision is healing well.  Patient reports baby is doing well in NICU- "[place]". Patient is scheduled for postpartum visit on Dec 5th. Patient made aware to call us if she develops any new symptoms and reassured her that if she was at the hospital they could also be a resource to check her blood pressure if needed. Reassured blood pressure is great today and continue taking her medications as prescribed.  Kathrene Alu RN

## 2022-05-13 ENCOUNTER — Other Ambulatory Visit (HOSPITAL_COMMUNITY): Payer: Self-pay

## 2022-05-14 ENCOUNTER — Ambulatory Visit: Payer: Self-pay

## 2022-05-14 NOTE — Lactation Note (Signed)
This note was copied from a baby's chart.  NICU Lactation Consultation Note  Patient Name: Emily Foster Today's Date: 05/14/2022 Age:22 wk.o.   Subjective Reason for consult: Follow-up assessment; Primapara; 1st time breastfeeding; NICU baby; Preterm <34wks; Infant < 6lbs  Visited with family of 63 38/21 weeks old (adjusted) NICU female; Emily Foster is a P1 and reports that her supply has decreased. Noticed she hasn't been pumping consistently and sleeping through the night. Explained the importance of consistent pumping and not going more than 4-6 hours without pumping at night in order to protect her supply; she voiced understanding. Reviewed pumping schedule, pump settings, lactogenesis II/III and benefits of STS care.  Objective Infant data: Mother's Current Feeding Choice: Breast Milk  Infant feeding assessment Scale for Readiness: 3  Maternal data: [id number]  C-Section, Low Transverse Pumping frequency: 6 times/24 hours Pumped volume: 60 mL Flange Size: 24 WIC Program: No WIC Referral Sent?: Yes Pump: Personal (WIC pump)  Assessment Infant: In NICU  Maternal: Milk volume: Low  Intervention/Plan Interventions: Breast feeding basics reviewed; DEBP; Education; Coconut oil Tools: Pump; Flanges; Coconut oil Pump Education: Setup, frequency, and cleaning; Milk Storage  Plan of care: Encouraged pumping every 3 hours, ideally 8 times/24 hours or the closest she can get to it; will try not going more than 4-6 hours without pumping at night She'll start power pumping in the AM Parent will continue practicing STS care    FOB present but asleep. All questions and concerns answered, family to contact [facility] services PRN.  Consult Status: NICU follow-up  NICU Follow-up type: Weekly NICU follow up   Emily Foster 05/14/2022, 11:50 AM

## 2022-05-16 ENCOUNTER — Other Ambulatory Visit (HOSPITAL_COMMUNITY): Payer: Self-pay

## 2022-05-16 ENCOUNTER — Other Ambulatory Visit: Payer: Self-pay | Admitting: Obstetrics and Gynecology

## 2022-05-21 ENCOUNTER — Other Ambulatory Visit (HOSPITAL_COMMUNITY): Payer: Self-pay

## 2022-05-26 ENCOUNTER — Telehealth: Payer: BC Managed Care – PPO | Admitting: Nurse Practitioner

## 2022-05-26 DIAGNOSIS — B3731 Acute candidiasis of vulva and vagina: Secondary | ICD-10-CM | POA: Diagnosis not present

## 2022-05-26 MED ORDER — FLUCONAZOLE 150 MG PO TABS
150.0000 mg | ORAL_TABLET | Freq: Once | ORAL | 0 refills | Status: AC
  Filled 2022-05-26: qty 1, 1d supply, fill #0

## 2022-05-26 NOTE — Progress Notes (Signed)

## 2022-05-26 NOTE — Progress Notes (Signed)
I have spent 5 minutes in review of e-visit questionnaire, review and updating patient chart, medical decision making and response to patient.  ° °Amore Grater W Jo Cerone, NP ° °  °

## 2022-05-27 ENCOUNTER — Other Ambulatory Visit (HOSPITAL_COMMUNITY): Payer: Self-pay

## 2022-05-30 ENCOUNTER — Ambulatory Visit: Payer: Self-pay

## 2022-05-30 NOTE — Lactation Note (Signed)
This note was copied from a baby's chart.  NICU Lactation Consultation Note  Patient Name: Emily Foster Today's Date: 05/30/2022 Age:22 wk.o.   Subjective Reason for consult: Follow-up assessment; Primapara; 1st time breastfeeding; NICU baby; Preterm <34wks  Infant with readiness score of 3 at last SLP visit. Parent (Emily Foster) present at time of consult. She reports that her milk volume is BNL; she is pumping 5-6 times/day sometimes up to one hour. She has tried power pumping. Lactation provided education on how frequency of pumping is more impactful than duration of pumping for milk volume. I recommended that she augment her routine with 2 additional daily sessions (even shorter 10 minute sessions will work). She can also research and explore a nutritional supplement such as moringa. I recommended that she allow a minimum of four days to note changes in milk volume.  I provided some introductory education on IDF, infant readiness for PO feeding, and pre-feeding activities such as lick and learn.  Objective Infant data: Mother's Current Feeding Choice: Breast Milk  Infant feeding assessment Scale for Readiness: 3  Maternal data: [id number]  C-Section, Low Transverse Current breast feeding challenges:: NICU  Does the patient have breastfeeding experience prior to this delivery?: No  Pumping frequency: 5-6 times/day Pumped volume: 30 mL  WIC Program: Yes WIC Referral Sent?: Yes Pump:  (WIC pump)  Intervention/Plan Tools: Pump Pump Education: Setup, frequency, and cleaning  Plan: Consult Status: NICU follow-up  NICU Follow-up type: Weekly NICU follow up    Walker Shadow 05/30/2022, 5:36 PM

## 2022-06-04 ENCOUNTER — Ambulatory Visit (INDEPENDENT_AMBULATORY_CARE_PROVIDER_SITE_OTHER): Payer: BC Managed Care – PPO | Admitting: Advanced Practice Midwife

## 2022-06-04 ENCOUNTER — Other Ambulatory Visit: Payer: Self-pay

## 2022-06-04 ENCOUNTER — Encounter: Payer: Self-pay | Admitting: Advanced Practice Midwife

## 2022-06-04 ENCOUNTER — Other Ambulatory Visit (HOSPITAL_COMMUNITY): Payer: Self-pay

## 2022-06-04 VITALS — BP 124/87 | HR 66 | Wt 187.0 lb

## 2022-06-04 DIAGNOSIS — O1410 Severe pre-eclampsia, unspecified trimester: Secondary | ICD-10-CM

## 2022-06-04 DIAGNOSIS — Z30017 Encounter for initial prescription of implantable subdermal contraceptive: Secondary | ICD-10-CM

## 2022-06-04 DIAGNOSIS — Z98891 History of uterine scar from previous surgery: Secondary | ICD-10-CM

## 2022-06-04 MED ORDER — ETONOGESTREL 68 MG ~~LOC~~ IMPL
68.0000 mg | DRUG_IMPLANT | Freq: Once | SUBCUTANEOUS | Status: AC
  Administered 2022-06-04: 68 mg via SUBCUTANEOUS

## 2022-06-04 NOTE — Progress Notes (Signed)
Post Partum Visit Note  Emily Foster is a 21 y.o. [phone] female who presents for a postpartum visit. She is 6 weeks postpartum following a primary cesarean section.  I have fully reviewed the prenatal and intrapartum course. The delivery was at 28.5 gestational weeks.  Anesthesia: epidural. Postpartum course has been complicated by caring for infant son in NICU. Baby is doing well and growing. Baby is feeding by  feeding tube in NICU . Bleeding no bleeding. Bowel function is normal. Bladder function is normal. Patient is not sexually active. Contraception method is Nexplanon. Postpartum depression screening: negative.   The pregnancy intention screening data noted above was reviewed. Potential methods of contraception were discussed. The patient elected to proceed with No data recorded.    Health Maintenance Due  Topic Date Due   COVID-19 Vaccine (1) Never done   HPV VACCINES (1 - 2-dose series) Never done   INFLUENZA VACCINE  01/29/2022    The following portions of the patient's history were reviewed and updated as appropriate: allergies, current medications, past family history, past medical history, past social history, past surgical history, and problem list.  Review of Systems Pertinent items noted in HPI and remainder of comprehensive ROS otherwise negative.  Objective:  LMP 10/04/2021    General:  alert, cooperative, and no distress   Breasts:  normal  Lungs: Normal effort  Heart:  Normal rate  Abdomen: soft, non-tender; bowel sounds normal; no masses,  no organomegaly   Wound well approximated incision  GU exam:  not indicated        Patient given informed consent, she signed consent form. Pregnancy test was negative.  Appropriate time out taken.  Patient's left arm was prepped and draped in the usual sterile fashion.. The ruler used to measure and mark insertion area.  Patient was prepped with alcohol swab and then injected with 5 ml of 1 % lidocaine.  She was  prepped with betadine, Nexplanon removed from packaging,  Device confirmed in needle, then inserted full length of needle and withdrawn per handbook instructions.  There was minimal blood loss.  Patient insertion site covered with guaze and a pressure bandage to reduce any bruising.  The patient tolerated the procedure well and was given post procedure instructions. Return in about one month for Nexplanon check.  Nexplanon Insertion Procedure Patient identified, informed consent performed, consent signed.   Patient does understand that irregular bleeding is a very common side effect of this medication. She was advised to have backup contraception for one week after placement. Appropriate time out taken.  Patient's left arm was prepped and draped in the usual sterile fashion.. The ruler used to measure and mark insertion area.  Patient was prepped with alcohol swab and then injected with 3 ml of 1% lidocaine.  She was prepped with betadine, Nexplanon removed from packaging,  Device confirmed in needle, then inserted full length of needle and withdrawn per handbook instructions. Nexplanon was able to palpated in the patient's arm; patient palpated the insert herself. There was minimal blood loss.  Patient insertion site covered with guaze and a pressure bandage to reduce any bruising.  The patient tolerated the procedure well and was given post procedure instructions.      Assessment:   Normal Postoperative postpartum exam.  Severe Preeclampsia necessitating preterm delivery Improved hypertention Desires Nexplanon  Plan:   Essential components of care per ACOG recommendations:  1.  Mood and well being: Patient with negative depression screening today. Reviewed local  resources for support.  - Patient tobacco use? No.   - hx of drug use? No.    2. Infant care and feeding:  -Patient currently breastmilk feeding? Yes. Reviewed importance of draining breast regularly to support lactation.  -Social  determinants of health (SDOH) reviewed in EPIC. No concerns  3. Sexuality, contraception and birth spacing - Patient does not want a pregnancy in the next year.   - Reviewed reproductive life planning. Reviewed contraceptive methods based on pt preferences and effectiveness.  Patient desired Hormonal Implant today.   - Discussed birth spacing of 18 months  4. Sleep and fatigue -Encouraged family/partner/community support of 4 hrs of uninterrupted sleep to help with mood and fatigue  5. Physical Recovery  - Discussed patients delivery and complications. She describes her labor as mixed. - Patient had a C-section. . Patient expressed understanding - Patient has urinary incontinence? No. - Patient is safe to resume physical and sexual activity  6.  Health Maintenance - HM due items addressed  - Last pap smear  Diagnosis  Date Value Ref Range Status  01/24/2022   Final   - Negative for Intraepithelial Lesions or Malignancy (NILM)  01/24/2022 - Benign reactive/reparative changes  Final   Pap smear not done at today's visit.  -Breast Cancer screening indicated? No.   7. Chronic Disease/Pregnancy Condition follow up: None  - PCP follow up  Armandina Stammer, RN [facility] [employer], [facility]   [place], [place]

## 2022-06-05 ENCOUNTER — Other Ambulatory Visit (HOSPITAL_COMMUNITY): Payer: Self-pay

## 2022-06-12 ENCOUNTER — Telehealth: Payer: BC Managed Care – PPO | Admitting: Emergency Medicine

## 2022-06-12 DIAGNOSIS — N898 Other specified noninflammatory disorders of vagina: Secondary | ICD-10-CM

## 2022-06-12 NOTE — Progress Notes (Signed)
E-Visit for Vaginal Symptoms  We are sorry that you are not feeling well. Here is how we plan to help! Based on what you shared with me it looks like you: May have a yeast vaginosis  Vaginosis is an inflammation of the vagina that can result in discharge, itching and pain. The cause is usually a change in the normal balance of vaginal bacteria or an infection. Vaginosis can also result from reduced estrogen levels after menopause.  The most common causes of vaginosis are:   Bacterial vaginosis which results from an overgrowth of one on several organisms that are normally present in your vagina.   Yeast infections which are caused by a naturally occurring fungus called candida.   Vaginal atrophy (atrophic vaginosis) which results from the thinning of the vagina from reduced estrogen levels after menopause.   Trichomoniasis which is caused by a parasite and is commonly transmitted by sexual intercourse.  Factors that increase your risk of developing vaginosis include: Medications, such as antibiotics and steroids Uncontrolled diabetes Use of hygiene products such as bubble bath, vaginal spray or vaginal deodorant Douching Wearing damp or tight-fitting clothing Using an intrauterine device (IUD) for birth control Hormonal changes, such as those associated with pregnancy, birth control pills or menopause Sexual activity Having a sexually transmitted infection  Your treatment plan is Monistat (miconazole) or Gyne-Lotrimin (clotrimazole) over the counter at most pharmacies. You can also typically purchase these medicines at grocery stores.   Be sure to take all of the medication as directed. Stop taking any medication if you develop a rash, tongue swelling or shortness of breath. Mothers who are breast feeding should consider pumping and discarding their breast milk while on these antibiotics. However, there is no consensus that infant exposure at these doses would be harmful.  Remember that  medication creams can weaken latex condoms. Marland Kitchen   HOME CARE:  Good hygiene may prevent some types of vaginosis from recurring and may relieve some symptoms:  Avoid baths, hot tubs and whirlpool spas. Rinse soap from your outer genital area after a shower, and dry the area well to prevent irritation. Don't use scented or harsh soaps, such as those with deodorant or antibacterial action. Avoid irritants. These include scented tampons and pads. Wipe from front to back after using the toilet. Doing so avoids spreading fecal bacteria to your vagina.  Other things that may help prevent vaginosis include:  Don't douche. Your vagina doesn't require cleansing other than normal bathing. Repetitive douching disrupts the normal organisms that reside in the vagina and can actually increase your risk of vaginal infection. Douching won't clear up a vaginal infection. Use a latex condom. Both female and female latex condoms may help you avoid infections spread by sexual contact. Wear cotton underwear. Also wear pantyhose with a cotton crotch. If you feel comfortable without it, skip wearing underwear to bed. Yeast thrives in [employer] Your symptoms should improve in the next day or two.  GET HELP RIGHT AWAY IF:  You have pain in your lower abdomen ( pelvic area or over your ovaries) You develop nausea or vomiting You develop a fever Your discharge changes or worsens You have persistent pain with intercourse You develop shortness of breath, a rapid pulse, or you faint.  These symptoms could be signs of problems or infections that need to be evaluated by a medical provider now.  MAKE SURE YOU   Understand these instructions. Will watch your condition. Will get help right away if you are  not doing well or get worse.  Thank you for choosing an e-visit.  Your e-visit answers were reviewed by a board certified advanced clinical practitioner to complete your personal care plan. Depending upon the  condition, your plan could have included both over the counter or prescription medications.  Please review your pharmacy choice. Make sure the pharmacy is open so you can pick up prescription now. If there is a problem, you may contact your provider through [employer] and have the prescription routed to another pharmacy.  Your safety is important to [place]. If you have drug allergies check your prescription carefully.   For the next 24 hours you can use MyChart to ask questions about today's visit, request a non-urgent call back, or ask for a work or school excuse. You will get an email in the next two days asking about your experience. I hope that your e-visit has been valuable and will speed your recovery.  I have spent 5 minutes in review of e-visit questionnaire, review and updating patient chart, medical decision making and response to patient.   Willeen Cass, PhD, FNP-BC

## 2022-06-13 ENCOUNTER — Encounter: Payer: Self-pay | Admitting: *Deleted

## 2022-06-18 ENCOUNTER — Ambulatory Visit: Payer: BC Managed Care – PPO

## 2022-06-18 ENCOUNTER — Telehealth: Payer: BC Managed Care – PPO | Admitting: Physician Assistant

## 2022-06-18 DIAGNOSIS — J069 Acute upper respiratory infection, unspecified: Secondary | ICD-10-CM

## 2022-06-18 NOTE — Progress Notes (Signed)
E-Visit for Upper Respiratory Infection   We are sorry you are not feeling well.  Here is how we plan to help!  Based on what you have shared with me, it looks like you may have a viral upper respiratory infection.  Upper respiratory infections are caused by a large number of viruses; however, rhinovirus is the most common cause.   Symptoms vary from person to person, with common symptoms including sore throat, cough, fatigue or lack of energy and feeling of general discomfort.  A low-grade fever of up to 100.4 may present, but is often uncommon.  Symptoms vary however, and are closely related to a person's age or underlying illnesses.  The most common symptoms associated with an upper respiratory infection are nasal discharge or congestion, cough, sneezing, headache and pressure in the ears and face.  These symptoms usually persist for about 3 to 10 days, but can last up to 2 weeks.  It is important to know that upper respiratory infections do not cause serious illness or complications in most cases.    Upper respiratory infections can be transmitted from person to person, with the most common method of transmission being a person's hands.  The virus is able to live on the skin and can infect other persons for up to 2 hours after direct contact.  Also, these can be transmitted when someone coughs or sneezes; thus, it is important to cover the mouth to reduce this risk.  To keep the spread of the illness at bay, good hand hygiene is very important.  This is an infection that is most likely caused by a virus. There are no specific treatments other than to help you with the symptoms until the infection runs its course.  We are sorry you are not feeling well.  Here is how we plan to help!   For nasal congestion, you may use plain Mucinex.  Saline nasal spray or nasal drops can help and can safely be used as often as needed for congestion.   If you do not have a history of heart disease, hypertension,  diabetes or thyroid disease, prostate/bladder issues or glaucoma, you may also use Sudafed to treat nasal congestion.  It is highly recommended that you consult with a pharmacist or your primary care physician to ensure this medication is safe for you to take.     If you have a cough, you may use cough suppressants such as Delsym.   If you have a sore or scratchy throat, use a saltwater gargle-  to  teaspoon of salt dissolved in a 4-ounce to 8-ounce glass of warm water.  Gargle the solution for approximately 15-30 seconds and then spit.  It is important not to swallow the solution.  You can also use throat lozenges/cough drops and Chloraseptic spray to help with throat pain or discomfort.  Warm or cold liquids can also be helpful in relieving throat pain.  For headache, pain or general discomfort, you can use Tylenol or Ibuprofen as directed.    HOME CARE Only take medications as instructed by your medical team. Be sure to drink plenty of fluids. Water is fine as well as fruit juices, sodas and electrolyte beverages. You may want to stay away from caffeine or alcohol. If you are nauseated, try taking small sips of liquids. How do you know if you are getting enough fluid? Your urine should be a pale yellow or almost colorless. Get rest. Taking a steamy shower or using a humidifier may help  nasal congestion and ease sore throat pain. You can place a towel over your head and breathe in the steam from hot water coming from a faucet. Using a saline nasal spray works much the same way. Cough drops, hard candies and sore throat lozenges may ease your cough. Avoid close contacts especially the very young and the elderly Cover your mouth if you cough or sneeze Always remember to wash your hands.   GET HELP RIGHT AWAY IF: You develop worsening fever. If your symptoms do not improve within 10 days You develop yellow or green discharge from your nose over 3 days. You have coughing fits You develop a  severe head ache or visual changes. You develop shortness of breath, difficulty breathing or start having chest pain Your symptoms persist after you have completed your treatment plan  MAKE SURE YOU  Understand these instructions. Will watch your condition. Will get help right away if you are not doing well or get worse.  Thank you for choosing an e-visit.  Your e-visit answers were reviewed by a board certified advanced clinical practitioner to complete your personal care plan. Depending upon the condition, your plan could have included both over the counter or prescription medications.  Please review your pharmacy choice. Make sure the pharmacy is open so you can pick up prescription now. If there is a problem, you may contact your provider through [employer] and have the prescription routed to another pharmacy.  Your safety is important to [place]. If you have drug allergies check your prescription carefully.   For the next 24 hours you can use MyChart to ask questions about today's visit, request a non-urgent call back, or ask for a work or school excuse. You will get an email in the next two days asking about your experience. I hope that your e-visit has been valuable and will speed your recovery.

## 2022-06-18 NOTE — Progress Notes (Signed)
I have spent 5 minutes in review of e-visit questionnaire, review and updating patient chart, medical decision making and response to patient.   Chace Bisch Cody Latoyna Hird, PA-C    

## 2022-06-23 ENCOUNTER — Ambulatory Visit: Payer: Self-pay

## 2022-06-23 NOTE — Lactation Note (Signed)
This note was copied from a baby's chart.  NICU Lactation Consultation Note  Patient Name: Emily Foster Today's Date: 06/23/2022 Age:22 m.o.  Subjective Reason for consult: Follow-up assessment; NICU baby; Preterm <34wks; Infant < 6lbs; 1st time breastfeeding  Lactation followed up with Emily Foster to check on progress with pumping. She states that she obtains about 60 mls/session q4 hours, and sometimes obtains more. Her son is receiving her breast milk exclusively. I praised her for her hard work, and I reinforced education regarding increasing frequency to q3 hours if she wished to increase her milk volume.  Lactation has not seen Emily Foster in several weeks. Emily Foster and her support person state that they have been away from the NICU for a week or so due to illness.   Emily Foster states that she does not have any additional questions for lactation at this time. I encouraged her to call, as needed.  Objective Infant data: Mother's Current Feeding Choice: Breast Milk  Infant feeding assessment Scale for Readiness: 2   Maternal data: [id number]  C-Section, Low Transverse  Current breast feeding challenges:: NICU  Pumping frequency: q 4 hours; parent is aware of recommendation of pumping q3 hours for optimal milk volume Pumped volume: 60 mL (60-90)   WIC Program: Yes WIC Referral Sent?: Yes Pump:  (WIC pump)  Assessment \\Maternal : Milk volume: Normal   Intervention/Plan Interventions: Breast feeding basics reviewed; Education  Pump Education: Setup, frequency, and cleaning  Plan: Consult Status: NICU follow-up  NICU Follow-up type: Weekly NICU follow up    Walker Shadow 06/23/2022, 4:06 PM

## 2022-07-06 ENCOUNTER — Ambulatory Visit: Payer: Self-pay

## 2022-07-06 NOTE — Lactation Note (Signed)
This note was copied from a baby's chart.  NICU Lactation Consultation Note  Patient Name: Emily Foster Today's Date: 07/06/2022 Age:23 m.o.  Subjective Reason for consult: Follow-up assessment; NICU baby; Term; 1st time breastfeeding; 43  Visited with family of 35 34/41 weeks old (adjusted) NICU female; baby DJ has been showing some emerging feeding cues but he's not ready to go to breast yet. Parents have been working on some pre-feeding activities. Ms. Deakins reports that pumping is going well, she is still not pumping consistently every 3 hours, but her supply remains stable. Reviewed pumping schedule, strategies to increase supply, galactagogues and IDF 1/2. Her plan is to do combo feeding taking baby to breast and also pumping/bottle feeding.   Objective Infant data: Mother's Current Feeding Choice: Breast Milk  Infant feeding assessment Scale for Readiness: 2  Maternal data: [id number]  C-Section, Low Transverse Pumping frequency: 6 times/24 hours Pumped volume: 60 mL Flange Size: 24 WIC Program: Yes WIC Referral Sent?: Yes Pump: Personal (WIC pump)  Assessment Infant: In NICU  Maternal: Milk volume: Low  Intervention/Plan Interventions: Breast feeding basics reviewed; DEBP; Education; Infant Driven Feeding Algorithm education Tools: Pump; Flanges Pump Education: Setup, frequency, and cleaning; Milk Storage  Plan of care: Encouraged pumping every 3 hours, ideally 8 times/24 hours or the closest she can get to it; will try not going more than 4-6 hours without pumping at night She'll continue power pumping in the AM Parents will continue practicing STS care and other pre-feeding activities     FOB present and supportive. All questions and concerns answered, family to contact [facility] services PRN.  Consult Status: NICU follow-up  NICU Follow-up type: Weekly NICU follow up   [place] 07/06/2022, 4:18 PM

## 2022-07-11 ENCOUNTER — Inpatient Hospital Stay (HOSPITAL_COMMUNITY): Admit: 2022-07-11 | Payer: Self-pay

## 2022-07-13 ENCOUNTER — Inpatient Hospital Stay (HOSPITAL_COMMUNITY): Admit: 2022-07-13 | Payer: Self-pay

## 2022-07-15 ENCOUNTER — Ambulatory Visit: Payer: Self-pay

## 2022-07-15 NOTE — Lactation Note (Signed)
This note was copied from a baby's chart.  NICU Lactation Consultation Note  Patient Name: Emily Foster Today's Date: 07/15/2022 Age:23 m.o.  Subjective Reason for consult: Follow-up assessment; NICU baby; Term; 1st time breastfeeding; 74  Visited with family of 88 25/48 weeks old (adjusted) NICU female; Ms. Venturini is a P1 and reports she's now pumping more frequently and her supply has slightly increased; praised her for her efforts. GOB feeding baby a bottle when entering the room; baby RJ has been taking more than 50% of his PO feedings by bottle. Her plan is to do combo feeding, breast and bottle feedings; asked her to call for latch assistance when needed.   Objective Infant data: Mother's Current Feeding Choice: Breast Milk  Infant feeding assessment Scale for Readiness: 1 Scale for Quality: 3  Maternal data: [id number]  C-Section, Low Transverse Pumping frequency: 6-8 times/24 hours Pumped volume: 60 mL (up to 90 ml. in the AM) Flange Size: 24 WIC Program: Yes WIC Referral Sent?: Yes Pump: Personal (WIC pump)  Assessment Infant: In NICU  Maternal: Milk volume: Low  Intervention/Plan Interventions: Breast feeding basics reviewed; DEBP; Education; Infant Driven Feeding Algorithm education Tools: Pump; Flanges Pump Education: Setup, frequency, and cleaning; Milk Storage  Plan of care: Encouraged pumping every 3 hours, ideally 8 times/24 hours or the closest she can get to it; will try not going more than 4-6 hours without pumping at night She'll continue power pumping in the AM Ms. Wiegel will start taking baby to breast and will call for assistance PRN Parents will continue advancing PO feedings via bottle   FOB and GOB present and supportive. All questions and concerns answered, family to contact [facility] services PRN.  Consult Status: NICU follow-up  NICU Follow-up type: Weekly NICU follow up; Assist with IDF-2 (Mother does not need to pre-pump before  breastfeeding)   Carynn Felling S Fahima Cifelli 07/15/2022, 4:00 PM

## 2022-07-25 ENCOUNTER — Ambulatory Visit: Payer: Self-pay

## 2022-07-25 NOTE — Lactation Note (Signed)
This note was copied from a baby's chart. Lactation Consultation Note  Patient Name: Emily Foster Today's Date: 07/25/2022   Age:23 m.o.  LC called to speak with Emily Foster as RN requested.  Emily Foster visits in the evenings.  Emily Foster states she has been having pain in her right breast that started 3 days ago.  She describes an initial sharp pain into breast with a green discharge.  Now she said the areolar skin is peeling and bleeding.  Emily Foster denies burning pain on nipple. She still is pumping.  Emily Foster denies fever, headache or flu-like symptoms.  No reddened and warm area on breast.   LC recommended to Emily Foster that she call her provider OB to rule out abscess etc.    Discussed All Purpose Nipple Ointment (APNO) that would need to be prescribed and sent to a compounding pharmacy.   Encouraged Emily Foster to reach out to lactation as needed.  Emily Foster 07/25/2022, 4:34 PM

## 2022-07-27 ENCOUNTER — Ambulatory Visit: Payer: Self-pay

## 2022-07-27 NOTE — Lactation Note (Signed)
This note was copied from a baby's chart.  NICU Lactation Consultation Note  Patient Name: Emily Foster [id number] Date: 07/27/2022 Age:23 m.o.  Subjective Reason for consult: Follow-up assessment; NICU baby; Primapara; 1st time breastfeeding; Term  Visited with family of 23 64/72 weeks old (adjusted) NICU female; Ms. Mottram is a P1 and reports that her R breast is better now. No S/S of engorgement, mastitis or yeast at this time but noted dry spots (see Assessment); skin is no longer peeling or bleeding during or after pumping sessions. Her plan is to take baby to breast in addition of doing bottles, asked her to call for latch assistance when needed; he's currently on ad lib status.  Objective Infant data: Mother's Current Feeding Choice: Breast Milk  Infant feeding assessment Scale for Readiness: 2 Scale for Quality: 2  Maternal data: [id number]  C-Section, Low Transverse Pumping frequency: 10 times/24 hours Pumped volume: 30 mL (30-60 ml) Flange Size: 27 (changed from # 24 to # 27) WIC Program: Yes WIC Referral Sent?: Yes Pump: Personal (WIC pump)  Assessment Infant: Feeding Status: Ad lib  Maternal: Milk volume: Low R breast has three dry spots of about 1 cm diameter that resembles dry skin/eczema. When pumping occurs, they tend to swell and bleed. Breast are soft.   Intervention/Plan Interventions: Breast feeding basics reviewed; Coconut oil; DEBP; Education  Tools: Pump; Flanges Pump Education: Setup, frequency, and cleaning; Milk Storage  Plan of care: Continue bilateral pumping every 3 hours, ideally 8 sessions/24 hours or more as desired She'll consult with her provider for Dx and treatment of dry spots, will continue using coconut oil or shea butter in the meantime  She'll call for latch assistance prior baby's discharge, currently working on bottle feedings  No other support person at this time. All questions and concerns answered, family to contact [facility] services  PRN.  Consult Status: NICU follow-up  NICU Follow-up type: Weekly NICU follow up; Assist with IDF-2 (Mother does not need to pre-pump before breastfeeding)   Jeena Arnett S Eufemia Prindle 07/27/2022, 11:40 AM

## 2022-07-29 ENCOUNTER — Ambulatory Visit: Payer: Self-pay

## 2022-07-29 NOTE — Lactation Note (Addendum)
This note was copied from a baby's chart. Lactation Consultation Note  Patient Name: Boy Niger Quast WOEHO'Z Date: 07/29/2022 Reason for consult: Follow-up assessment;Primapara;NICU baby;Term Age:23 m.o.  LC in to visit with P10 Mom of baby in the NICU on day of discharge.  Parents holding baby.  Mom is interested in breastfeeding baby.    Encouraged STS when she is home, offering the breast with feeding cues, before or after bottle supplement.  Mom's milk supply has increased dramatically from a week ago.  LC suggested Milk Thistle supplement.  Inquired if Mom was interested in breastfeeding baby with OP lactation support. Mom agreeable to [facility] sending a message to S. Hice RN IBCLC  Encouraged Mom to ask for help prn. Mom denies needing any assistance from Lactation at present.   Lactation Tools Discussed/Used Pumping frequency: Q 3hrs Pumped volume: 150 mL  Interventions Interventions: Hand express;Skin to skin;Breast massage;DEBP;Education  Discharge Discharge Education: Outpatient recommendation;Outpatient Epic message sent  Consult Status Consult Status: Complete Date: 07/29/22 Follow-up type: [place], Dontasia Miranda E 07/29/2022, 11:37 AM

## 2022-10-20 ENCOUNTER — Encounter (HOSPITAL_COMMUNITY): Payer: Self-pay

## 2022-10-20 ENCOUNTER — Emergency Department (HOSPITAL_COMMUNITY)
Admission: EM | Admit: 2022-10-20 | Discharge: 2022-10-20 | Disposition: A | Discharge status: Home / Self Care | Payer: BC Managed Care – PPO | Attending: Emergency Medicine | Admitting: Emergency Medicine

## 2022-10-20 ENCOUNTER — Other Ambulatory Visit: Payer: Self-pay

## 2022-10-20 ENCOUNTER — Emergency Department (HOSPITAL_COMMUNITY): Payer: BC Managed Care – PPO

## 2022-10-20 DIAGNOSIS — W04XXXA Fall while being carried or supported by other persons, initial encounter: Secondary | ICD-10-CM | POA: Insufficient documentation

## 2022-10-20 DIAGNOSIS — W19XXXA Unspecified fall, initial encounter: Secondary | ICD-10-CM

## 2022-10-20 DIAGNOSIS — M79631 Pain in right forearm: Secondary | ICD-10-CM | POA: Diagnosis not present

## 2022-10-20 DIAGNOSIS — M79641 Pain in right hand: Secondary | ICD-10-CM | POA: Diagnosis not present

## 2022-10-20 MED ORDER — DICLOFENAC SODIUM 1 % EX GEL: 4.0000 g | Freq: Four times a day (QID) | CUTANEOUS | 0 refills | Status: DC

## 2022-10-20 MED ORDER — LIDOCAINE 5 % EX PTCH: 1.0000 | MEDICATED_PATCH | CUTANEOUS | 0 refills | Status: DC

## 2022-10-20 MED ORDER — IBUPROFEN 800 MG PO TABS
800.0000 mg | ORAL_TABLET | Freq: Once | ORAL | Status: AC
  Administered 2022-10-20: 800 mg via ORAL
  Filled 2022-10-20: qty 1

## 2022-10-20 MED ORDER — ACETAMINOPHEN 500 MG PO TABS
500.0000 mg | ORAL_TABLET | ORAL | Status: AC
  Administered 2022-10-20: 500 mg via ORAL
  Filled 2022-10-20: qty 1

## 2022-10-20 MED ORDER — LIDOCAINE 5 % EX PTCH
3.0000 | MEDICATED_PATCH | CUTANEOUS | Status: DC
  Administered 2022-10-20: 3 via TRANSDERMAL
  Filled 2022-10-20: qty 3

## 2022-10-20 NOTE — ED Triage Notes (Signed)
C/o mechanical fall while holding child and went to protect Childs head and heard a pop.  Pain noted to right forearm radiating into fingers.  Tender with palpitation.  Tylenol at home w/o relief

## 2022-10-20 NOTE — ED Notes (Signed)
Ice applied. Radial pulse intact. Decreased ROM. Pt reports numbness

## 2022-10-20 NOTE — ED Provider Notes (Signed)
[facility] AT [facility] Provider Note   CSN: [id number] Arrival date & time: 10/20/22  9147     History  Chief Complaint  Patient presents with   Fall   Hand Pain    Emily Foster is a 22 y.o. female.  The history is provided by the patient.  Fall This is a new problem. The current episode started 3 to 5 hours ago. The problem occurs constantly. The problem has been resolved. Pertinent negatives include no chest pain, no abdominal pain, no headaches and no shortness of breath. Nothing aggravates the symptoms. Nothing relieves the symptoms. She has tried acetaminophen (one 500 mg tab) for the symptoms. The treatment provided no relief.  Hand Pain This is a new problem. The current episode started 3 to 5 hours ago. The problem occurs constantly. The problem has not changed since onset.Pertinent negatives include no chest pain, no abdominal pain, no headaches and no shortness of breath. Nothing aggravates the symptoms. Nothing relieves the symptoms. The treatment provided no relief.  Patient fell striking right hand and arm on the hardwood floor.       Home Medications Prior to Admission medications   Medication Sig Start Date End Date Taking? Authorizing Provider  diclofenac Sodium (VOLTAREN) 1 % GEL Apply 4 g topically 4 (four) times daily. 10/20/22  Yes Schuyler Olden, MD  lidocaine (LIDODERM) 5 % Place 1 patch onto the skin daily. Remove & Discard patch within 12 hours or as directed by MD 10/20/22  Yes Luv Mish, MD  acetaminophen (TYLENOL) 500 MG tablet Take 1 tablet (500 mg total) by mouth every 6 (six) hours as needed. 04/25/22   Milas Hock, MD  amLODipine (NORVASC) 10 MG tablet Take 1 tablet (10 mg total) by mouth daily. 04/25/22   Milas Hock, MD  ibuprofen (ADVIL) 600 MG tablet Take 1 tablet (600 mg total) by mouth every 6 (six) hours. 04/25/22   Milas Hock, MD  oxyCODONE-acetaminophen (PERCOCET/ROXICET) 5-325 MG tablet Take 1-2  tablets by mouth every 6 (six) hours as needed for moderate pain or severe pain. Patient not taking: Reported on 04/30/2022 04/25/22   Milas Hock, MD  Prenatal Vit-Fe Fumarate-FA (PREPLUS) 27-1 MG TABS Take 1 tablet by mouth daily. 11/13/21   Bernerd Limbo, CNM      Allergies    Soy allergy    Review of Systems   Review of Systems  Respiratory:  Negative for shortness of breath.   Cardiovascular:  Negative for chest pain.  Gastrointestinal:  Negative for abdominal pain.  Musculoskeletal:  Positive for arthralgias.  Neurological:  Negative for headaches.  All other systems reviewed and are negative.   Physical Exam Updated Vital Signs BP (!) 146/95   Pulse 84   Temp 98.5 F (36.9 C)   Resp 20   Wt 84 kg   SpO2 100%   Breastfeeding No   BMI 30.82 kg/m  Physical Exam Vitals and nursing note reviewed.  Constitutional:      Appearance: Normal appearance.  HENT:     Head: Normocephalic and atraumatic.     Nose: Nose normal.  Cardiovascular:     Rate and Rhythm: Normal rate and regular rhythm.     Pulses: Normal pulses.     Heart sounds: Normal heart sounds.  Pulmonary:     Effort: Pulmonary effort is normal.     Breath sounds: Normal breath sounds.  Abdominal:     General: Bowel sounds are normal.  Musculoskeletal:  Right forearm: Normal.     Right wrist: No swelling, deformity, effusion, lacerations, tenderness, bony tenderness, snuff box tenderness or crepitus. Normal range of motion. Normal pulse.     Right hand: No swelling, deformity or lacerations. Normal range of motion. Normal strength. Normal sensation. There is no disruption of two-point discrimination. Normal capillary refill. Normal pulse.     Cervical back: Normal range of motion and neck supple.  Skin:    General: Skin is warm and dry.     Capillary Refill: Capillary refill takes less than 2 seconds.  Neurological:     General: No focal deficit present.     Mental Status: She is alert and  oriented to person, place, and time.     Deep Tendon Reflexes: Reflexes normal.  Psychiatric:        Mood and Affect: Mood normal.     ED Results / Procedures / Treatments   Labs (all labs ordered are listed, but only abnormal results are displayed) Labs Reviewed - No data to display  EKG None  Radiology DG Forearm Right  Result Date: 10/20/2022 CLINICAL DATA:  22 year old female status post fall. Pain. EXAM: RIGHT FOREARM - 2 VIEW COMPARISON:  Right hand series reported separately. FINDINGS: Bone mineralization is within normal limits. Maintained alignment at the right elbow and wrist. Lateral elbow is slightly oblique, no obvious joint effusion there. As visible on the right hand series, the right radius and ulna appear intact. No acute osseous abnormality identified. No discrete soft tissue injury. IMPRESSION: No acute fracture or dislocation identified about the right forearm. Electronically Signed   By: Odessa Fleming M.D.   On: 10/20/2022 04:37   DG Hand Complete Right  Result Date: 10/20/2022 CLINICAL DATA:  22 year old female status post fall.  Pain. EXAM: RIGHT HAND - COMPLETE 3+ VIEW COMPARISON:  None Available. FINDINGS: Bone mineralization is within normal limits. Distal radius and ulna appear intact; there is a subtle corticated deformity of the ulnar styloid. Carpal bone alignment and joint spaces maintained. Joint spaces and alignment in the right hand appear maintained and normal. No acute fracture or dislocation identified. No discrete soft tissue injury. IMPRESSION: No acute fracture or dislocation identified. Electronically Signed   By: Odessa Fleming M.D.   On: 10/20/2022 04:35    Procedures Procedures    Medications Ordered in ED Medications  lidocaine (LIDODERM) 5 % 3 patch (3 patches Transdermal Patch Applied 10/20/22 0430)  ibuprofen (ADVIL) tablet 800 mg (800 mg Oral Given 10/20/22 0406)  acetaminophen (TYLENOL) tablet 500 mg (500 mg Oral Given 10/20/22 0430)    ED Course/  Medical Decision Making/ A&P                             Medical Decision Making Patient fell striking hand and RUE  Amount and/or Complexity of Data Reviewed Radiology: ordered and independent interpretation performed.    Details: Negative hand and forearm Xray by me   Risk OTC drugs. Prescription drug management. Risk Details: No snuff box tenderness no fractures on Xray.  Patient is breast feeding so patient may take tylenol and have prescribed patches and gel as they are not systemically absorbed and will not affect breast feeding.  Stable for discharge.  Strict return    Final Clinical Impression(s) / ED Diagnoses Final diagnoses:  Fall, initial encounter  Right hand pain   Return for intractable cough, coughing up blood, fevers > 100.4 unrelieved by medication,  shortness of breath, intractable vomiting, chest pain, shortness of breath, weakness, numbness, changes in speech, facial asymmetry, abdominal pain, passing out, Inability to tolerate liquids or food, cough, altered mental status or any concerns. No signs of systemic illness or infection. The patient is nontoxic-appearing on exam and vital signs are within normal limits.  I have reviewed the triage vital signs and the nursing notes. Pertinent labs & imaging results that were available during my care of the patient were reviewed by me and considered in my medical decision making (see chart for details). After history, exam, and medical workup I feel the patient has been appropriately medically screened and is safe for discharge home. Pertinent diagnoses were discussed with the patient. Patient was given return precautions.    Rx / DC Orders ED Discharge Orders          Ordered    diclofenac Sodium (VOLTAREN) 1 % GEL  4 times daily        10/20/22 0453    lidocaine (LIDODERM) 5 %  Every 24 hours        10/20/22 0453              Jnya Brossard, MD 10/20/22 8295

## 2022-11-05 ENCOUNTER — Other Ambulatory Visit (HOSPITAL_COMMUNITY): Payer: Self-pay

## 2022-11-12 IMAGING — US US OB < 14 WEEKS - US OB TV
1 series · 15 of 28 positions shown · non-contrast
Comparison: None Available.

CLINICAL DATA: Vaginal bleeding.

EXAM:
OBSTETRIC <14 WK US AND TRANSVAGINAL OB US
TECHNIQUE: Both transabdominal and transvaginal ultrasound examinations were
performed for complete evaluation of the gestation as well as the
maternal uterus, adnexal regions, and pelvic cul-de-sac.
Transvaginal technique was performed to assess early pregnancy.

[Series 1: us ob < 14 weeks - us ob tv · 15 of 53 slices shown]
[im 1/53]
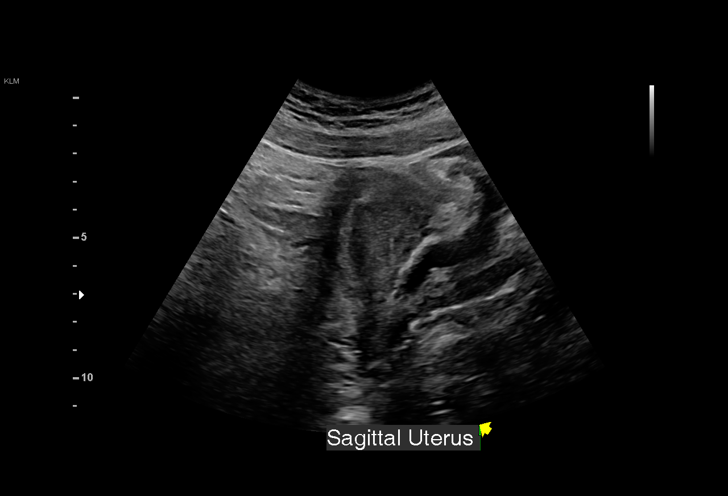
[im 4/53]
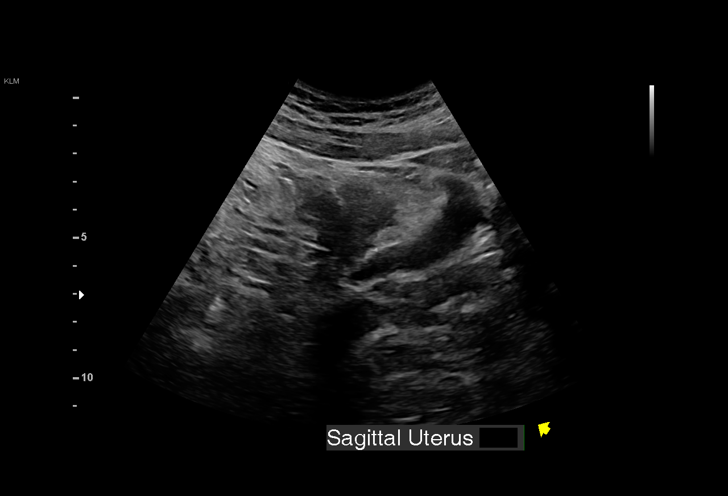
[im 8/53]
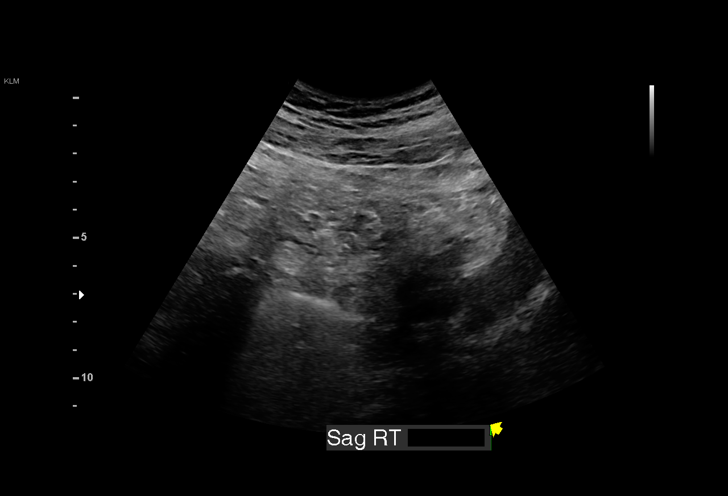
[im 12/53]
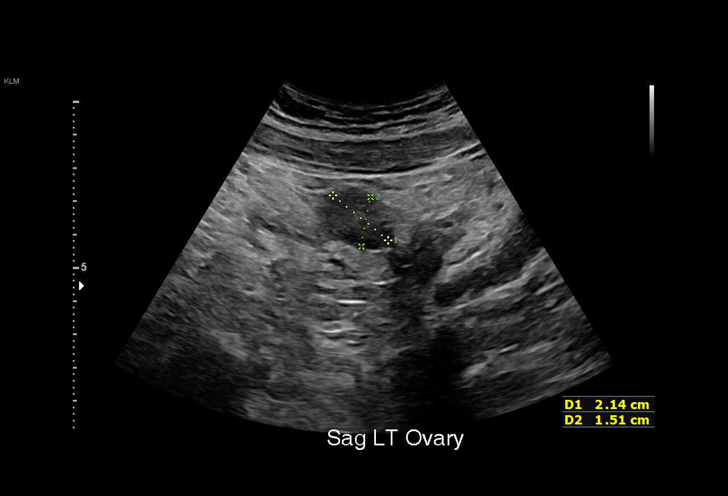
[im 16/53]
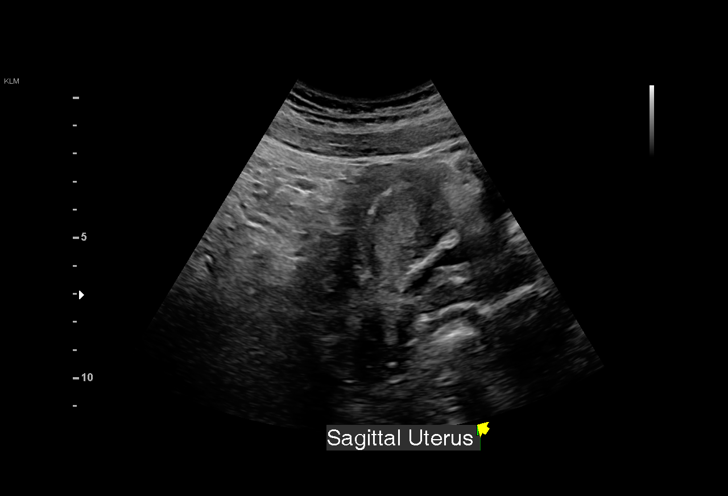
[im 20/53]
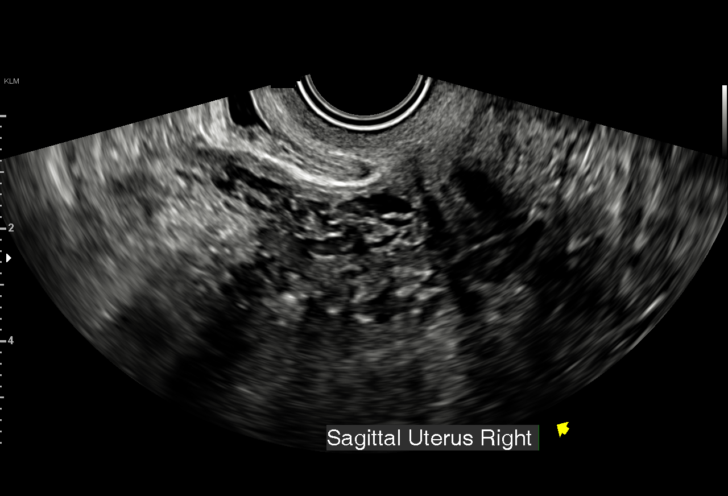
[im 24/53]
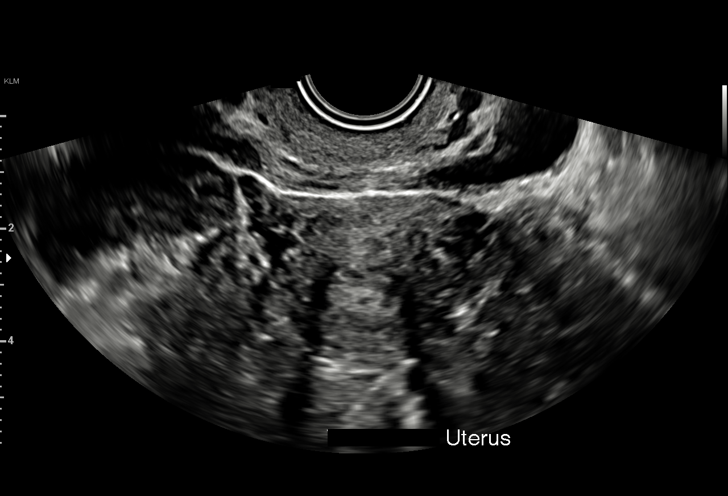
[im 27/53]
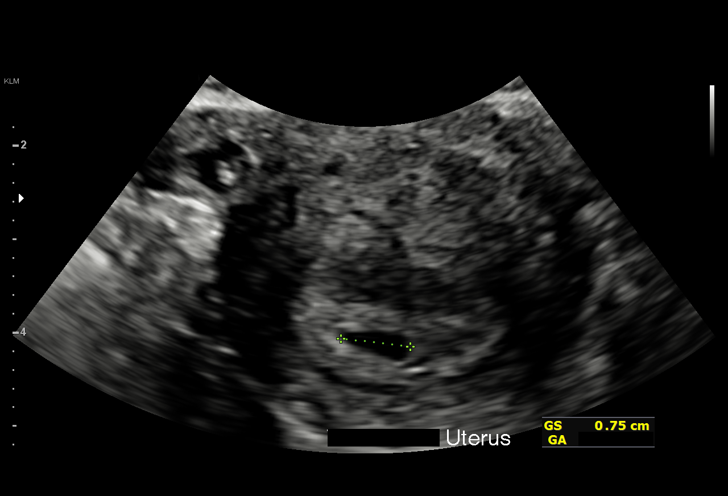
[im 29/53]
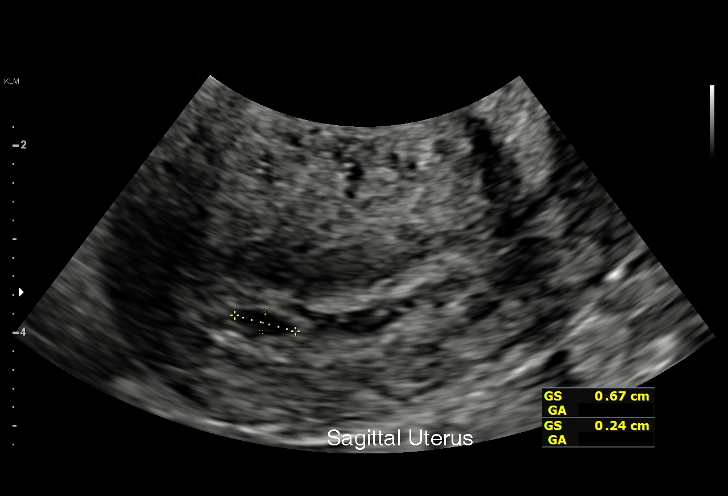
[im 33/53]
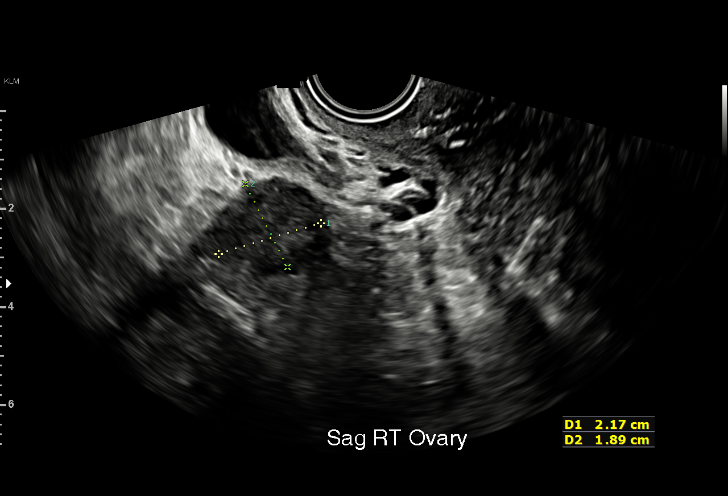
[im 37/53]
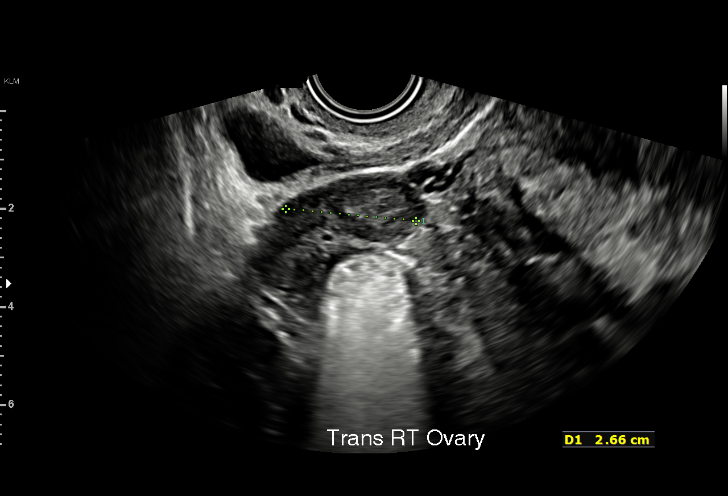
[im 41/53]
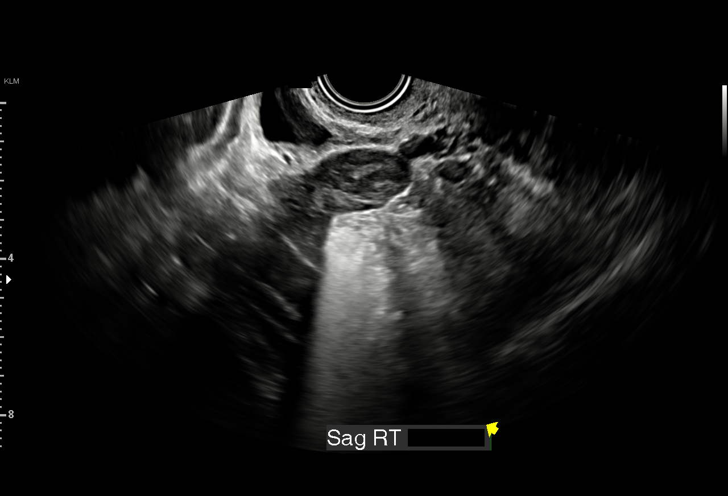
[im 45/53]
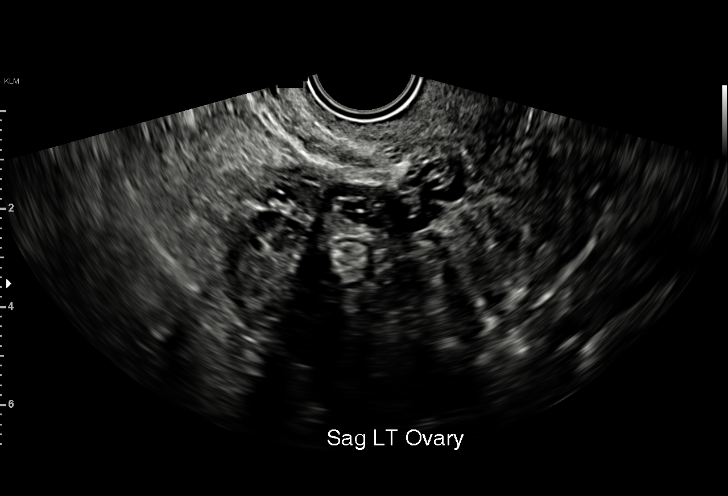
[im 49/53]
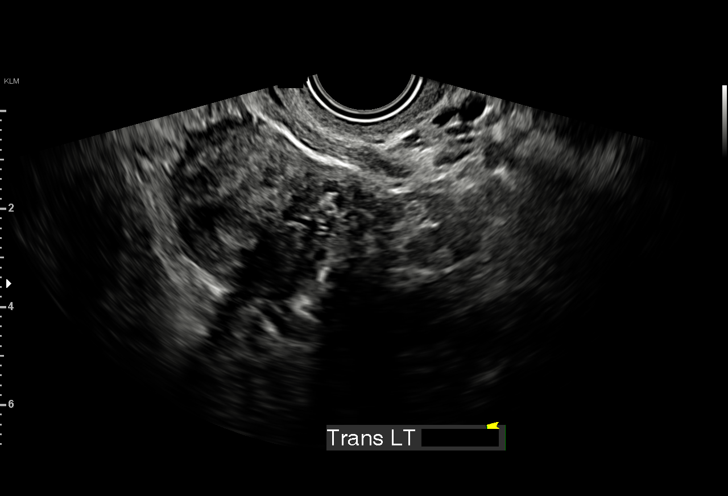
[im 53/53]
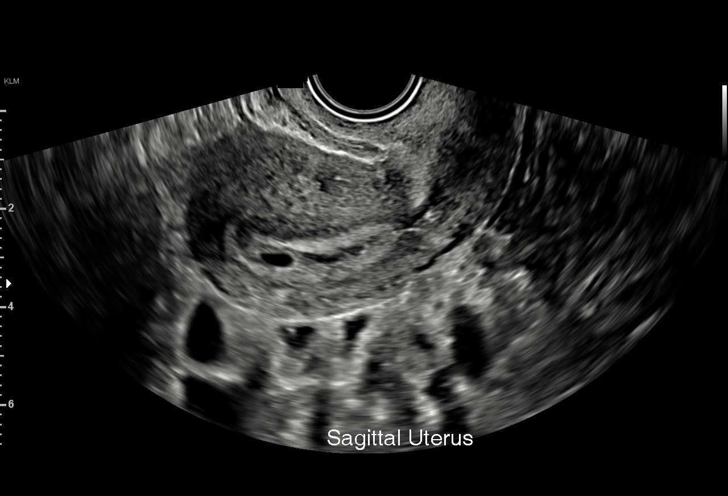

[15 of 28 positions shown; findings below may reference images not displayed]

FINDINGS: Intrauterine gestational sac: Single

Yolk sac:  Not Visualized.

Embryo:  Not Visualized.

MSD: 5.5 mm   5 w   2 d

Subchorionic hemorrhage: None visualized. There is a small amount of
fluid in the endometrial canal.

Maternal uterus/adnexae: Bilateral ovaries are within normal limits.
No free fluid.
IMPRESSION: 1. Probable early intrauterine gestational sac, but no yolk sac,
fetal pole, or cardiac activity yet visualized. Recommend follow-up
quantitative B-HCG levels and follow-up US in 14 days to assess
viability. This recommendation follows SRU consensus guidelines:
Diagnostic Criteria for Nonviable Pregnancy Early in the First
Trimester. N Engl J Med 0974; [DATE].
2. There is a small amount of fluid in the endometrial canal.

## 2022-11-29 ENCOUNTER — Encounter (HOSPITAL_COMMUNITY): Payer: Self-pay

## 2022-11-29 ENCOUNTER — Emergency Department (HOSPITAL_COMMUNITY)
Admission: EM | Admit: 2022-11-29 | Discharge: 2022-11-29 | Disposition: A | Discharge status: Home / Self Care | Payer: BC Managed Care – PPO | Attending: Emergency Medicine | Admitting: Emergency Medicine

## 2022-11-29 DIAGNOSIS — M79644 Pain in right finger(s): Secondary | ICD-10-CM | POA: Diagnosis not present

## 2022-11-29 DIAGNOSIS — L03011 Cellulitis of right finger: Secondary | ICD-10-CM | POA: Diagnosis not present

## 2022-11-29 MED ORDER — CEPHALEXIN 500 MG PO CAPS: 500.0000 mg | ORAL_CAPSULE | Freq: Two times a day (BID) | ORAL | 0 refills | Status: DC

## 2022-11-29 MED ORDER — LIDOCAINE HCL (PF) 1 % IJ SOLN
30.0000 mL | Freq: Once | INTRAMUSCULAR | Status: AC
  Administered 2022-11-29: 30 mL
  Filled 2022-11-29: qty 30

## 2022-11-29 MED ORDER — CEPHALEXIN 500 MG PO CAPS
500.0000 mg | ORAL_CAPSULE | Freq: Once | ORAL | Status: AC
  Administered 2022-11-29: 500 mg via ORAL
  Filled 2022-11-29: qty 1

## 2022-11-29 NOTE — ED Triage Notes (Signed)
Pt presents to ED for evaluation of right ring finger swelling.

## 2022-11-29 NOTE — ED Provider Notes (Signed)
[facility]-EMER[facility]ENCY DEPT [facility] Emergency Department Provider Note MRN:  [id number]  Arrival date & time: 11/29/22     Chief Complaint   Hand Pain   History of Present Illness   Emily Foster is a 22 y.o. year-old female presents to the ED with chief complaint of right ring finger pain.  She states that she bites her nails.  She states that she has had paronychias in the past and feels like she has developed another.  She reports swelling to the right ring finger on the ulnar aspect near the nail fold.  She denies fevers or chills.  She states that she is breast-feeding.  She denies any antibiotic allergies.  History provided by patient.   Review of Systems  Pertinent positive and negative review of systems noted in HPI.    Physical Exam   Vitals:   11/29/22 0123  BP: 128/84  Pulse: 83  Resp: 17  Temp: 98.4 F (36.9 C)  SpO2: 100%    CONSTITUTIONAL:  well-appearing, NAD NEURO:  Alert and oriented x 3, CN 3-12 grossly intact EYES:  eyes equal and reactive ENT/NECK:  Supple, no stridor  CARDIO:  appears well-perfused  PULM:  No respiratory distress,  GI/GU:  non-distended,  MSK/SPINE:  No gross deformities, no edema, moves all extremities  SKIN:  no rash, atraumatic, paronychia to the ulnar aspect of the right ring finger   *Additional and/or pertinent findings included in MDM below  Diagnostic and Interventional Summary    EKG Interpretation  Date/Time:    Ventricular Rate:    PR Interval:    QRS Duration:   QT Interval:    QTC Calculation:   R Axis:     Text Interpretation:         Labs Reviewed - No data to display  No orders to display    Medications  lidocaine (PF) (XYLOCAINE) 1 % injection 30 mL (has no administration in time range)  cephALEXin (KEFLEX) capsule 500 mg (has no administration in time range)     Procedures  /  Critical Care .Marland KitchenIncision and Drainage  Date/Time: 11/29/2022 2:09 AM  Performed by: Roxy Horseman,  PA-C Authorized by: Roxy Horseman, PA-C   Consent:    Consent obtained:  Verbal   Consent given by:  Patient   Risks discussed:  Bleeding, incomplete drainage, pain and damage to other organs   Alternatives discussed:  No treatment Universal protocol:    Procedure explained and questions answered to patient or proxy's satisfaction: yes     Relevant documents present and verified: yes     Test results available : yes     Imaging studies available: yes     Required blood products, implants, devices, and special equipment available: yes     Site/side marked: yes     Immediately prior to procedure, a time out was called: yes     Patient identity confirmed:  Verbally with patient Location:    Type:  Abscess Pre-procedure details:    Skin preparation:  Betadine Anesthesia:    Anesthesia method:  Nerve block   Block location:  Right ring finger   Block needle gauge:  27 G   Block anesthetic:  Lidocaine 1% w/o epi   Block injection procedure:  Incremental injection, introduced needle, negative aspiration for blood, anatomic landmarks palpated and anatomic landmarks identified   Block outcome:  Anesthesia achieved Procedure type:    Complexity:  Simple Procedure details:    Incision types:  Single straight  Incision depth:  Subcutaneous   Wound management:  Probed and deloculated, irrigated with saline and extensive cleaning   Drainage:  Purulent   Drainage amount:  Moderate Post-procedure details:    Procedure completion:  Tolerated well, no immediate complications   ED Course and Medical Decision Making  I have reviewed the triage vital signs, the nursing notes, and pertinent available records from the EMR.  Social Determinants Affecting Complexity of Care: Patient has no clinically significant social determinants affecting this chief complaint..   ED Course:    Medical Decision Making Risk Prescription drug management.     Consultants: No consultations were  needed in caring for this patient.   Treatment and Plan: Emergency department workup does not suggest an emergent condition requiring admission or immediate intervention beyond  what has been performed at this time. The patient is safe for discharge and has  been instructed to return immediately for worsening symptoms, change in  symptoms or any other concerns    Final Clinical Impressions(s) / ED Diagnoses     ICD-10-CM   1. Paronychia of finger of right hand  L03.011       ED Discharge Orders          Ordered    cephALEXin (KEFLEX) 500 MG capsule  2 times daily        11/29/22 0208              Discharge Instructions Discussed with and Provided to Patient:   Discharge Instructions   None      Roxy Horseman, PA-C 11/29/22 Elliot Gault, MD 11/29/22 [phone]

## 2022-12-06 ENCOUNTER — Telehealth: Payer: BC Managed Care – PPO | Admitting: Family Medicine

## 2022-12-06 DIAGNOSIS — K047 Periapical abscess without sinus: Secondary | ICD-10-CM

## 2022-12-06 NOTE — Progress Notes (Signed)
For the safety of you and your child, I recommend a face to face office visit with a health care provider.   Many mothers need to take medicines during their pregnancy and while nursing.  Almost all medicines pass into the breast milk in small quantities.  Most are generally considered safe for a mother to take but some medicines must be avoided.  After reviewing your E-Visit request, I recommend that you consult your OB/GYN or pediatrician for medical advice in relation to your condition and prescription medications while pregnant or breastfeeding.   NOTE:  There will be NO CHARGE for this eVisit   

## 2023-01-27 ENCOUNTER — Telehealth: Payer: BC Managed Care – PPO | Admitting: Physician Assistant

## 2023-01-27 DIAGNOSIS — K047 Periapical abscess without sinus: Secondary | ICD-10-CM

## 2023-01-27 NOTE — Progress Notes (Signed)
Because of severity of pain and inability to open mouth all of the way, and recent treatment for dental infection, I feel your condition warrants further evaluation and I recommend that you be seen in a face to face visit.   NOTE: There will be NO CHARGE for this eVisit   If you are having a true medical emergency please call 911.      For an urgent face to face visit, [place] has eight urgent care centers for your convenience:   NEW!! [facility] Health Urgent Care Center at [facility] Get Driving Directions [phone] [address], [address] [place], [address]    [facility] Health Urgent Care Center at [facility] Get Driving Directions [phone] [address] [place], [place] [address]   [facility] Health Urgent Care Center [facility]) Get Driving Directions [phone] [address] [place], [place] [address]  [facility] Health Urgent Care Center [facility]) Get Driving Directions [phone] [address] [place],  [place]  [address]  [facility] Health Urgent Care Center [facility] - at [employer]  [phone] [place],  [place] [address]   [facility] Health Urgent Care at [facility] Get Driving Directions [phone] [address] [place], [place] [address]   [facility] Health Urgent Care at [facility] Get Driving Directions  [phone] [address] [place], [place] [address]   [facility] Health Urgent Care at [facility] Directions [phone] [address], [address] [place], [place] [address]  Your MyChart E-visit questionnaire answers were reviewed by a board certified advanced clinical practitioner to complete your personal care plan based on your specific symptoms.  Thank you for using e-Visits.

## 2023-03-27 ENCOUNTER — Telehealth: Payer: Self-pay | Admitting: Family Medicine

## 2023-03-27 NOTE — Telephone Encounter (Addendum)
Called patient: has been having regular menses with nexplanon. Missed period this month (light spotting) - took UPT which was positive.   Will have pt come in to office next week for testing and removal of nexplanon.

## 2023-04-02 ENCOUNTER — Ambulatory Visit (INDEPENDENT_AMBULATORY_CARE_PROVIDER_SITE_OTHER): Payer: BC Managed Care – PPO | Admitting: Family Medicine

## 2023-04-02 VITALS — BP 130/83 | HR 89 | Wt 234.0 lb

## 2023-04-02 DIAGNOSIS — N912 Amenorrhea, unspecified: Secondary | ICD-10-CM | POA: Diagnosis not present

## 2023-04-02 DIAGNOSIS — Z3202 Encounter for pregnancy test, result negative: Secondary | ICD-10-CM | POA: Diagnosis not present

## 2023-04-02 LAB — POCT URINE PREGNANCY: Preg Test, Ur: NEGATIVE

## 2023-04-02 NOTE — Progress Notes (Signed)
   Subjective:    Patient ID: Emily Foster, female    DOB: 2000-07-07, 22 y.o.   MRN: [id number]  HPI Patient here for + UPT at home. Has nexplanon in place. No other complaints   Review of Systems     Objective:   Physical Exam Vitals reviewed.  Constitutional:      Appearance: Normal appearance.  Skin:    Capillary Refill: Capillary refill takes less than 2 seconds.  Neurological:     General: No focal deficit present.     Mental Status: She is alert.  Psychiatric:        Mood and Affect: Mood normal.        Behavior: Behavior normal.        Thought Content: Thought content normal.        Judgment: Judgment normal.           Assessment & Plan:  1. Amenorrhea Our UPT is neg. Will send for beta hCG - POCT urine pregnancy - Beta hCG quant (ref lab)

## 2023-04-02 NOTE — Progress Notes (Signed)
Negative UPT

## 2023-04-03 LAB — BETA HCG QUANT (REF LAB): hCG Quant: <1 m[IU]/mL

## 2023-07-17 ENCOUNTER — Ambulatory Visit: Payer: BC Managed Care – PPO | Admitting: Family Medicine

## 2023-07-17 VITALS — BP 124/76 | HR 81 | Ht 65.0 in | Wt 242.0 lb

## 2023-07-17 DIAGNOSIS — Z3202 Encounter for pregnancy test, result negative: Secondary | ICD-10-CM

## 2023-07-17 DIAGNOSIS — Z3046 Encounter for surveillance of implantable subdermal contraceptive: Secondary | ICD-10-CM | POA: Diagnosis not present

## 2023-07-17 DIAGNOSIS — N912 Amenorrhea, unspecified: Secondary | ICD-10-CM

## 2023-07-17 LAB — POCT URINE PREGNANCY: Preg Test, Ur: NEGATIVE

## 2023-07-17 MED ORDER — NORETHIN ACE-ETH ESTRAD-FE 1-20 MG-MCG(24) PO TABS: 1.0000 | ORAL_TABLET | Freq: Every day | ORAL | 3 refills | Status: DC

## 2023-07-17 NOTE — Progress Notes (Signed)
Wants to have nexplanon removed and restart COC.  Nexplanon Removal:  Patient given informed consent for removal of her Implanon, time out was performed.  Signed copy in the chart.  Appropriate time out taken. Implanon site identified.  Area prepped in usual sterile fashon. One cc of 1% lidocaine was used to anesthetize the area at the distal end of the implant. A small stab incision was made right beside the implant on the distal portion.  The implanon rod was grasped using hemostats and removed without difficulty.  There was less than 3 cc blood loss. There were no complications.  A small amount of antibiotic ointment and steri-strips were applied over the small incision.  A pressure bandage was applied to reduce any bruising.  The patient tolerated the procedure well and was given post procedure instructions.

## 2023-08-07 ENCOUNTER — Ambulatory Visit: Payer: BC Managed Care – PPO | Admitting: Family Medicine

## 2023-08-23 ENCOUNTER — Telehealth: Payer: BC Managed Care – PPO | Admitting: Family

## 2023-08-23 DIAGNOSIS — B3731 Acute candidiasis of vulva and vagina: Secondary | ICD-10-CM | POA: Diagnosis not present

## 2023-08-24 MED ORDER — FLUCONAZOLE 150 MG PO TABS: 150.0000 mg | ORAL_TABLET | ORAL | 0 refills | Status: DC | PRN

## 2023-08-24 NOTE — Progress Notes (Signed)

## 2023-08-25 ENCOUNTER — Telehealth: Payer: BC Managed Care – PPO | Admitting: Physician Assistant

## 2023-08-25 DIAGNOSIS — L308 Other specified dermatitis: Secondary | ICD-10-CM

## 2023-08-25 MED ORDER — TRIAMCINOLONE ACETONIDE 0.1 % EX CREA: 1.0000 | TOPICAL_CREAM | Freq: Two times a day (BID) | CUTANEOUS | 0 refills | Status: DC

## 2023-08-25 NOTE — Progress Notes (Signed)

## 2023-09-28 ENCOUNTER — Telehealth

## 2023-09-28 DIAGNOSIS — K047 Periapical abscess without sinus: Secondary | ICD-10-CM | POA: Diagnosis not present

## 2023-09-29 MED ORDER — AMOXICILLIN-POT CLAVULANATE 875-125 MG PO TABS: 1.0000 | ORAL_TABLET | Freq: Two times a day (BID) | ORAL | 0 refills | Status: DC

## 2023-09-29 NOTE — Progress Notes (Signed)

## 2023-10-04 ENCOUNTER — Telehealth: Admitting: Nurse Practitioner

## 2023-10-04 DIAGNOSIS — B3731 Acute candidiasis of vulva and vagina: Secondary | ICD-10-CM

## 2023-10-04 MED ORDER — FLUCONAZOLE 150 MG PO TABS: 150.0000 mg | ORAL_TABLET | Freq: Once | ORAL | 0 refills | Status: AC

## 2023-10-04 NOTE — Progress Notes (Signed)
 I have spent 5 minutes in review of e-visit questionnaire, review and updating patient chart, medical decision making and response to patient.   Claiborne Rigg, NP

## 2023-10-04 NOTE — Progress Notes (Signed)

## 2023-11-05 ENCOUNTER — Telehealth: Admitting: Nurse Practitioner

## 2023-11-05 DIAGNOSIS — T3695XA Adverse effect of unspecified systemic antibiotic, initial encounter: Secondary | ICD-10-CM | POA: Diagnosis not present

## 2023-11-05 DIAGNOSIS — B379 Candidiasis, unspecified: Secondary | ICD-10-CM | POA: Diagnosis not present

## 2023-11-05 MED ORDER — FLUCONAZOLE 150 MG PO TABS: 150.0000 mg | ORAL_TABLET | Freq: Once | ORAL | 0 refills | Status: AC

## 2023-11-05 NOTE — Progress Notes (Signed)

## 2023-11-28 ENCOUNTER — Telehealth: Admitting: Physician Assistant

## 2023-11-28 DIAGNOSIS — K0889 Other specified disorders of teeth and supporting structures: Secondary | ICD-10-CM

## 2023-11-28 NOTE — Progress Notes (Signed)
  Because you are having severe pain and facial swelling with a recurrent infection, I feel your condition warrants further evaluation and I recommend that you be seen in a face-to-face visit.   NOTE: There will be NO CHARGE for this E-Visit   If you are having a true medical emergency, please call 911.     For an urgent face to face visit, [place] has multiple urgent care centers for your convenience.  Click the link below for the full list of locations and hours, walk-in wait times, appointment scheduling options and driving directions:  Urgent Care - [place], [place], [place], [place], [place], [place]  [place]     Your MyChart E-visit questionnaire answers were reviewed by a board certified advanced clinical practitioner to complete your personal care plan based on your specific symptoms.    Thank you for using e-Visits.     I have spent 5 minutes in review of e-visit questionnaire, review and updating patient chart, medical decision making and response to patient.   Angelia Kelp, PA-C

## 2023-12-10 ENCOUNTER — Other Ambulatory Visit (HOSPITAL_COMMUNITY)
Admission: RE | Admit: 2023-12-10 | Discharge: 2023-12-10 | Disposition: A | Discharge status: Home / Self Care | Source: Ambulatory Visit | Attending: Family Medicine | Admitting: Family Medicine

## 2023-12-10 ENCOUNTER — Ambulatory Visit

## 2023-12-10 VITALS — BP 117/71 | HR 95 | Wt 248.0 lb

## 2023-12-10 DIAGNOSIS — Z3201 Encounter for pregnancy test, result positive: Secondary | ICD-10-CM

## 2023-12-10 DIAGNOSIS — O0990 Supervision of high risk pregnancy, unspecified, unspecified trimester: Secondary | ICD-10-CM | POA: Insufficient documentation

## 2023-12-10 DIAGNOSIS — Z3A01 Less than 8 weeks gestation of pregnancy: Secondary | ICD-10-CM | POA: Insufficient documentation

## 2023-12-10 DIAGNOSIS — O0991 Supervision of high risk pregnancy, unspecified, first trimester: Secondary | ICD-10-CM

## 2023-12-10 DIAGNOSIS — Z98891 History of uterine scar from previous surgery: Secondary | ICD-10-CM | POA: Insufficient documentation

## 2023-12-10 LAB — POCT URINE PREGNANCY: Preg Test, Ur: POSITIVE — AB

## 2023-12-10 NOTE — Progress Notes (Signed)
 New OB Intake  I connected with Emily Foster  on 12/10/23 at  2:30 PM EDT by in office visit and verified that I am speaking with the correct person using two identifiers. Nurse is located at [facility] and pt is located at [employer] [address] [employer].  I discussed the limitations, risks, security and privacy concerns of performing an evaluation and management service by telephone and the availability of in person appointments. I also discussed with the patient that there may be a patient responsible charge related to this service. The patient expressed understanding and agreed to proceed.  I explained I am completing New OB Intake today. We discussed EDD of 08/05/2024, by Last Menstrual Period. Pt is [id number]. I reviewed her allergies, medications and Medical/Surgical/OB history.    Patient Active Problem List   Diagnosis Date Noted   Supervision of high risk pregnancy, antepartum 12/10/2023   History of cesarean section 12/10/2023   S/P cesarean section 04/24/2022   Pre-eclampsia, severe 04/24/2022   Abruptio placenta, third trimester 04/24/2022     Concerns addressed today  Delivery Plans Plans to deliver at [facility]. Discussed the nature of our practice with multiple providers including residents and students. Due to the size of the practice, the delivering provider may not be the same as those providing prenatal care.   Patient is not interested in water  birth.  MyChart/Babyscripts MyChart access verified. I explained pt will have some visits in office and some virtually. Babyscripts instructions given and order placed. Patient verifies receipt of registration text/e-mail. Account successfully created and app downloaded. If patient is a candidate for Optimized scheduling, add to sticky note.   Blood Pressure Cuff/Weight Scale Blood pressure was provided at today's visit. Explained after first prenatal appt pt will check weekly and document in Babyscripts.  Anatomy  US  Explained first scheduled US  will be around 19 weeks. Anatomy US  will be scheduled at NOB appointment.   Is patient a CenteringPregnancy candidate?  Declined Declined due to Declined to say    Is patient a Mom+Baby Combined Care candidate?  Declined   If accepted, confirm patient does not intend to move from the area for at least 12 months, then notify Mom+Baby staff  Is patient a candidate for Babyscripts Optimization? No, due to high risk pregnancy    First visit review I reviewed new OB appt with patient. Explained pt will be seen by Dr. Fenton House at first visit. Discussed [facility] [place] genetic screening with patient. Panorama and Horizon.. Routine prenatal labs collected at this visit.   Last Pap Diagnosis  Date Value Ref Range Status  01/24/2022   Final   - Negative for Intraepithelial Lesions or Malignancy (NILM)  01/24/2022 - Benign reactive/reparative changes  Final    Jes Costales l Fidencia Mccloud, CMA 12/10/2023  2:59 PM

## 2023-12-11 ENCOUNTER — Ambulatory Visit: Payer: Self-pay | Admitting: Obstetrics and Gynecology

## 2023-12-11 LAB — COMPREHENSIVE METABOLIC PANEL WITH GFR
ALT: 19 IU/L (ref 0–32)
AST: 17 IU/L (ref 0–40)
Albumin: 4.5 g/dL (ref 4.0–5.0)
Alkaline Phosphatase: 113 IU/L (ref 44–121)
BUN/Creatinine Ratio: 8 — ABNORMAL LOW (ref 9–23)
BUN: 6 mg/dL (ref 6–20)
Bilirubin Total: 0.2 mg/dL (ref 0.0–1.2)
CO2: 20 mmol/L (ref 20–29)
Calcium: 9.4 mg/dL (ref 8.7–10.2)
Chloride: 104 mmol/L (ref 96–106)
Creatinine, Ser: 0.79 mg/dL (ref 0.57–1.00)
Globulin, Total: 2.6 g/dL (ref 1.5–4.5)
Glucose: 97 mg/dL (ref 70–99)
Potassium: 4 mmol/L (ref 3.5–5.2)
Sodium: 138 mmol/L (ref 134–144)
Total Protein: 7.1 g/dL (ref 6.0–8.5)
eGFR: 107 mL/min/{1.73_m2} (ref >59)

## 2023-12-11 LAB — CBC/D/PLT+RPR+RH+ABO+RUBIGG...
Antibody Screen: NEGATIVE
Basophils Absolute: 0 10*3/uL (ref 0.0–0.2)
Basos: 1 %
EOS (ABSOLUTE): 0.1 10*3/uL (ref 0.0–0.4)
Eos: 1 %
HCV Ab: NONREACTIVE
HIV Screen 4th Generation wRfx: NONREACTIVE
Hematocrit: 35.4 % (ref 34.0–46.6)
Hemoglobin: 11.5 g/dL (ref 11.1–15.9)
Hepatitis B Surface Ag: NEGATIVE
Immature Grans (Abs): 0 10*3/uL (ref 0.0–0.1)
Immature Granulocytes: 0 %
Lymphocytes Absolute: 2.2 10*3/uL (ref 0.7–3.1)
Lymphs: 27 %
MCH: 28.4 pg (ref 26.6–33.0)
MCHC: 32.5 g/dL (ref 31.5–35.7)
MCV: 87 fL (ref 79–97)
Monocytes Absolute: 0.6 10*3/uL (ref 0.1–0.9)
Monocytes: 7 %
Neutrophils Absolute: 5.3 10*3/uL (ref 1.4–7.0)
Neutrophils: 64 %
Platelets: 273 10*3/uL (ref 150–450)
RBC: 4.05 x10E6/uL (ref 3.77–5.28)
RDW: 12.1 % (ref 11.7–15.4)
RPR Ser Ql: NONREACTIVE
Rh Factor: POSITIVE
Rubella Antibodies, IGG: <0.9 {index} — ABNORMAL LOW (ref >0.99)
WBC: 8.2 10*3/uL (ref 3.4–10.8)

## 2023-12-11 LAB — PROTEIN / CREATININE RATIO, URINE
Creatinine, Urine: 84 mg/dL
Protein, Ur: 11.8 mg/dL
Protein/Creat Ratio: 140 mg/g{creat} (ref 0–200)

## 2023-12-11 LAB — HCV INTERPRETATION

## 2023-12-11 NOTE — Telephone Encounter (Signed)
-----   Message from Marci Setter sent at 12/11/2023  1:01 PM EDT ----- Rubella nonimmune- MMR postpartum Other labs within normal limits  ----- Message ----- From: Dot Gazella, CMA Sent: 12/10/2023   4:37 PM EDT To: Marci Setter, MD

## 2023-12-15 LAB — URINE CULTURE, OB REFLEX

## 2023-12-15 LAB — CULTURE, OB URINE

## 2023-12-16 ENCOUNTER — Other Ambulatory Visit

## 2023-12-16 ENCOUNTER — Other Ambulatory Visit: Payer: Self-pay

## 2023-12-16 NOTE — Telephone Encounter (Signed)
-----   Message from Marci Setter sent at 12/15/2023  5:57 PM EDT ----- +GBS in urine. No treatment needed at this low count as long as she does not have UTI symptoms. Will need antibiotics when she is in labor. Please review with patient and screen for symptoms. Thanks. ----- Message ----- From: Dot Gazella, CMA Sent: 12/10/2023   4:37 PM EDT To: Marci Setter, MD

## 2023-12-16 NOTE — Telephone Encounter (Signed)
 Patient made aware of +GBS in urine.  Will need tx in labor.  Emily Foster

## 2023-12-22 ENCOUNTER — Ambulatory Visit (INDEPENDENT_AMBULATORY_CARE_PROVIDER_SITE_OTHER)

## 2023-12-22 ENCOUNTER — Ambulatory Visit

## 2023-12-22 ENCOUNTER — Other Ambulatory Visit: Payer: Self-pay

## 2023-12-22 VITALS — BP 104/68 | HR 68

## 2023-12-22 DIAGNOSIS — O3680X0 Pregnancy with inconclusive fetal viability, not applicable or unspecified: Secondary | ICD-10-CM

## 2023-12-22 DIAGNOSIS — Z3687 Encounter for antenatal screening for uncertain dates: Secondary | ICD-10-CM | POA: Diagnosis not present

## 2023-12-22 DIAGNOSIS — Z3A00 Weeks of gestation of pregnancy not specified: Secondary | ICD-10-CM | POA: Diagnosis not present

## 2023-12-22 DIAGNOSIS — Z3201 Encounter for pregnancy test, result positive: Secondary | ICD-10-CM

## 2023-12-22 DIAGNOSIS — Z3480 Encounter for supervision of other normal pregnancy, unspecified trimester: Secondary | ICD-10-CM

## 2023-12-22 NOTE — Progress Notes (Signed)
 Patient here today for dating and viability US . Per consult with physician, patient to come back in 14 days for repeat dating and viability US . Patient denies any vaginal bleeding or abdominal pain. Reviewed MAU precaution with patient.   Patient scheduled for repeat US  on 01/05/24 at 0815. Patient voiced understanding and confirmed scheduled appointment.  Rosaline, RN

## 2023-12-30 ENCOUNTER — Encounter: Payer: Self-pay | Admitting: Family Medicine

## 2024-01-01 ENCOUNTER — Emergency Department (HOSPITAL_COMMUNITY)
Admission: AD | Admit: 2024-01-01 | Discharge: 2024-01-01 | Disposition: A | Discharge status: Home / Self Care | Attending: Obstetrics and Gynecology | Admitting: Obstetrics and Gynecology

## 2024-01-01 ENCOUNTER — Other Ambulatory Visit: Payer: Self-pay

## 2024-01-01 ENCOUNTER — Telehealth: Admitting: Physician Assistant

## 2024-01-01 ENCOUNTER — Emergency Department (HOSPITAL_COMMUNITY)

## 2024-01-01 ENCOUNTER — Encounter (HOSPITAL_COMMUNITY): Payer: Self-pay | Admitting: Obstetrics and Gynecology

## 2024-01-01 DIAGNOSIS — R0602 Shortness of breath: Secondary | ICD-10-CM | POA: Diagnosis not present

## 2024-01-01 DIAGNOSIS — R0789 Other chest pain: Secondary | ICD-10-CM | POA: Diagnosis not present

## 2024-01-01 DIAGNOSIS — R519 Headache, unspecified: Secondary | ICD-10-CM | POA: Diagnosis not present

## 2024-01-01 DIAGNOSIS — O99411 Diseases of the circulatory system complicating pregnancy, first trimester: Secondary | ICD-10-CM | POA: Insufficient documentation

## 2024-01-01 DIAGNOSIS — O99891 Other specified diseases and conditions complicating pregnancy: Secondary | ICD-10-CM | POA: Diagnosis not present

## 2024-01-01 DIAGNOSIS — Z3491 Encounter for supervision of normal pregnancy, unspecified, first trimester: Secondary | ICD-10-CM

## 2024-01-01 DIAGNOSIS — R0781 Pleurodynia: Secondary | ICD-10-CM

## 2024-01-01 LAB — CBC WITH DIFFERENTIAL/PLATELET
Abs Immature Granulocytes: 0.07 10*3/uL (ref 0.00–0.07)
Basophils Absolute: 0 10*3/uL (ref 0.0–0.1)
Basophils Relative: 0 %
Eosinophils Absolute: 0 10*3/uL (ref 0.0–0.5)
Eosinophils Relative: 0 %
HCT: 35.4 % — ABNORMAL LOW (ref 36.0–46.0)
Hemoglobin: 11.7 g/dL — ABNORMAL LOW (ref 12.0–15.0)
Immature Granulocytes: 0 %
Lymphocytes Relative: 11 %
Lymphs Abs: 1.7 10*3/uL (ref 0.7–4.0)
MCH: 28.8 pg (ref 26.0–34.0)
MCHC: 33.1 g/dL (ref 30.0–36.0)
MCV: 87.2 fL (ref 80.0–100.0)
Monocytes Absolute: 0.6 10*3/uL (ref 0.1–1.0)
Monocytes Relative: 4 %
Neutro Abs: 13.2 10*3/uL — ABNORMAL HIGH (ref 1.7–7.7)
Neutrophils Relative %: 85 %
Platelets: 311 10*3/uL (ref 150–400)
RBC: 4.06 MIL/uL (ref 3.87–5.11)
RDW: 12.5 % (ref 11.5–15.5)
WBC: 15.7 10*3/uL — ABNORMAL HIGH (ref 4.0–10.5)
nRBC: 0.1 % (ref 0.0–0.2)

## 2024-01-01 LAB — URINALYSIS, ROUTINE W REFLEX MICROSCOPIC
Bilirubin Urine: NEGATIVE
Glucose, UA: NEGATIVE mg/dL
Hgb urine dipstick: NEGATIVE
Ketones, ur: NEGATIVE mg/dL
Leukocytes,Ua: NEGATIVE
Nitrite: NEGATIVE
Protein, ur: NEGATIVE mg/dL
Specific Gravity, Urine: 1.006 (ref 1.005–1.030)
pH: 7 (ref 5.0–8.0)

## 2024-01-01 LAB — COMPREHENSIVE METABOLIC PANEL WITH GFR
ALT: 17 U/L (ref 0–44)
AST: 20 U/L (ref 15–41)
Albumin: 3.9 g/dL (ref 3.5–5.0)
Alkaline Phosphatase: 90 U/L (ref 38–126)
Anion gap: 11 (ref 5–15)
BUN: <5 mg/dL — ABNORMAL LOW (ref 6–20)
CO2: 21 mmol/L — ABNORMAL LOW (ref 22–32)
Calcium: 9.5 mg/dL (ref 8.9–10.3)
Chloride: 104 mmol/L (ref 98–111)
Creatinine, Ser: 0.68 mg/dL (ref 0.44–1.00)
GFR, Estimated: >60 mL/min (ref >60)
Glucose, Bld: 103 mg/dL — ABNORMAL HIGH (ref 70–99)
Potassium: 4.2 mmol/L (ref 3.5–5.1)
Sodium: 136 mmol/L (ref 135–145)
Total Bilirubin: 0.6 mg/dL (ref 0.0–1.2)
Total Protein: 6.9 g/dL (ref 6.5–8.1)

## 2024-01-01 LAB — BRAIN NATRIURETIC PEPTIDE: B Natriuretic Peptide: 19.5 pg/mL (ref 0.0–100.0)

## 2024-01-01 LAB — HCG, QUANTITATIVE, PREGNANCY: hCG, Beta Chain, Quant, S: 16475 m[IU]/mL — ABNORMAL HIGH (ref <5)

## 2024-01-01 MED ORDER — ACETAMINOPHEN 325 MG PO TABS
650.0000 mg | ORAL_TABLET | Freq: Once | ORAL | Status: AC
  Administered 2024-01-01: 650 mg via ORAL
  Filled 2024-01-01: qty 2

## 2024-01-01 NOTE — ED Triage Notes (Signed)
 PT arrives to ED from MAU. PT states she woke up this morning experiencing chest pain and sob. PT states she is [redacted] weeks pregnant. She is AxOx4.

## 2024-01-01 NOTE — Progress Notes (Signed)
  Because of shortness of breath and pain with breathing, especially giving current pregnancy which increases risks for clotting, etc, I feel your condition warrants further evaluation and I recommend that you be seen in a face-to-face visit at nearest UC/ER. If symptoms are severe, you need ER evaluation ASAP.   NOTE: There will be NO CHARGE for this E-Visit   If you are having a true medical emergency, please call 911.     For an urgent face to face visit, Emily Foster has multiple urgent care centers for your convenience.  Click the link below for the full list of locations and hours, walk-in wait times, appointment scheduling options and driving directions:  Urgent Care - [place], [place], [place], [place], [place], [place]  [place]     Your MyChart E-visit questionnaire answers were reviewed by a board certified advanced clinical practitioner to complete your personal care plan based on your specific symptoms.    Thank you for using e-Visits.

## 2024-01-01 NOTE — ED Provider Triage Note (Signed)
 Emergency Medicine Provider Triage Evaluation Note  Emily Foster , a 23 y.o. female  was evaluated in triage.  Pt complains of shortness of breath.  Patient approximately [redacted] weeks pregnant.  States she woke up this morning around 6 AM with sudden onset of shortness of breath and some chest pressure that she is describing is central in nature without any radiation.  Previous history of preeclampsia in her last pregnancy.  Has been on blood thinners in relation to this episode of preeclampsia but no history of PE or DVT.  Denies any fever, chills or bodyaches.  Endorses mild sore throat.  Review of Systems  Positive: As above Negative: As above  Physical Exam  BP 123/85   Pulse 89   Temp 98.6 F (37 C)   Resp 17   Ht 5' 5 (1.651 m)   Wt 101.8 kg   LMP 10/30/2023   SpO2 100%   BMI 37.34 kg/m  Gen:   Awake, no distress   Resp:  Normal effort  MSK:   Moves extremities without difficulty  Other:    Medical Decision Making  Medically screening exam initiated at 1:15 PM.  Appropriate orders placed.  Emily Scifres was informed that the remainder of the evaluation will be completed by another provider, this initial triage assessment does not replace that evaluation, and the importance of remaining in the ED until their evaluation is complete.  Blood pressure is unremarkable.  Patient breathing at normal rate and with normal SpO2.   Kaliopi Blyden A, PA-C 01/01/24 1316

## 2024-01-01 NOTE — ED Provider Notes (Signed)
 [place] EMERGENCY DEPARTMENT AT [facility] Provider Note   CSN: [id number] Arrival date & time: 01/01/24  1154     Patient presents with: Shortness of Breath, Headache, and Chest Pain   Emily Foster is a 23 y.o. female.   23 year old female presents for evaluation of chest pain.  She states that started today when she was at home.  She is currently pregnant was seen at MAU and sent over here for evaluation.  States he gets worse with breathing, but overall has improved from this morning.  States she is more short of breath this morning.  Describes as a tightness and pressure in the center of her chest that is nonradiating.  She has no history of high blood pressure or heart problems.  Denies any other symptoms or concerns at this time.   Shortness of Breath Associated symptoms: chest pain and headaches   Associated symptoms: no abdominal pain, no cough, no ear pain, no fever, no rash, no sore throat and no vomiting   Headache Associated symptoms: no abdominal pain, no back pain, no cough, no ear pain, no eye pain, no fever, no seizures, no sore throat and no vomiting   Chest Pain Associated symptoms: headache and shortness of breath   Associated symptoms: no abdominal pain, no back pain, no cough, no fever, no palpitations and no vomiting        Prior to Admission medications   Medication Sig Start Date End Date Taking? Authorizing Provider  amoxicillin -clavulanate (AUGMENTIN ) 875-125 MG tablet Take 1 tablet by mouth 2 (two) times daily. Patient not taking: Reported on 12/22/2023 09/29/23   Burnette, Jennifer M, PA-C  Norethindrone Acetate-Ethinyl Estrad-FE (LOESTRIN 24 FE) 1-20 MG-MCG(24) tablet Take 1 tablet by mouth daily. Patient not taking: Reported on 12/22/2023 07/17/23   Stinson, Jacob J, DO  triamcinolone  cream (KENALOG ) 0.1 % Apply 1 Application topically 2 (two) times daily. Patient not taking: Reported on 12/22/2023 08/25/23   Vivienne Delon HERO, PA-C     Allergies: Soy allergy (obsolete)    Review of Systems  Constitutional:  Negative for chills and fever.  HENT:  Negative for ear pain and sore throat.   Eyes:  Negative for pain and visual disturbance.  Respiratory:  Positive for shortness of breath. Negative for cough.   Cardiovascular:  Positive for chest pain. Negative for palpitations.  Gastrointestinal:  Negative for abdominal pain and vomiting.  Genitourinary:  Negative for dysuria and hematuria.  Musculoskeletal:  Negative for arthralgias and back pain.  Skin:  Negative for color change and rash.  Neurological:  Positive for headaches. Negative for seizures and syncope.  All other systems reviewed and are negative.   Updated Vital Signs BP 121/86   Pulse 87   Temp 98 F (36.7 C) (Oral)   Resp 18   Ht 5' 5 (1.651 m)   Wt 101.8 kg   LMP 10/30/2023   SpO2 100%   BMI 37.34 kg/m   Physical Exam Vitals and nursing note reviewed.  Constitutional:      General: She is not in acute distress.    Appearance: She is well-developed. She is not ill-appearing.  HENT:     Head: Normocephalic and atraumatic.  Eyes:     Conjunctiva/sclera: Conjunctivae normal.  Cardiovascular:     Rate and Rhythm: Normal rate and regular rhythm. No extrasystoles are present.    Heart sounds: No murmur heard. Pulmonary:     Effort: Pulmonary effort is normal. No respiratory distress.  Breath sounds: Normal breath sounds. No decreased breath sounds.  Chest:     Chest wall: No mass or tenderness.  Abdominal:     Palpations: Abdomen is soft.     Tenderness: There is no abdominal tenderness.  Musculoskeletal:        General: No swelling.     Cervical back: Neck supple.  Skin:    General: Skin is warm and dry.     Capillary Refill: Capillary refill takes less than 2 seconds.  Neurological:     Mental Status: She is alert.  Psychiatric:        Mood and Affect: Mood normal.     (all labs ordered are listed, but only abnormal  results are displayed) Labs Reviewed  CBC WITH DIFFERENTIAL/PLATELET - Abnormal; Notable for the following components:      Result Value   WBC 15.7 (*)    Hemoglobin 11.7 (*)    HCT 35.4 (*)    Neutro Abs 13.2 (*)    All other components within normal limits  COMPREHENSIVE METABOLIC PANEL WITH GFR - Abnormal; Notable for the following components:   CO2 21 (*)    Glucose, Bld 103 (*)    BUN <5 (*)    All other components within normal limits  HCG, QUANTITATIVE, PREGNANCY - Abnormal; Notable for the following components:   hCG, Beta Chain, Quant, S 16,475 (*)    All other components within normal limits  URINALYSIS, ROUTINE W REFLEX MICROSCOPIC - Abnormal; Notable for the following components:   Color, Urine STRAW (*)    All other components within normal limits  BRAIN NATRIURETIC PEPTIDE  TROPONIN I (HIGH SENSITIVITY)  TROPONIN I (HIGH SENSITIVITY)    EKG: EKG Interpretation Date/Time:  Thursday January 01 2024 13:00:20 EDT Ventricular Rate:  94 PR Interval:  134 QRS Duration:  80 QT Interval:  356 QTC Calculation: 445 R Axis:   80  Text Interpretation: Normal sinus rhythm Normal ECG No STEMI No acute change when compared with prior EKG from 07/13/2020 Confirmed by Gennaro Bouchard ([id number]) on 01/01/2024 4:43:18 PM  Radiology: [facility] Chest 2 View Result Date: 01/01/2024 CLINICAL DATA:  Short of breath, [redacted] weeks pregnant, chest pressure EXAM: CHEST - 2 VIEW COMPARISON:  04/18/2021 FINDINGS: Frontal and lateral views of the chest are obtained. Appropriate shielding of the patient's abdomen and pelvis was performed. Cardiac silhouette is unremarkable. No acute airspace disease, effusion, or pneumothorax. No acute bony abnormalities. IMPRESSION: 1. No acute intrathoracic process. Electronically Signed   By: Ozell Daring M.D.   On: 01/01/2024 14:07     Procedures   Medications Ordered in the ED  acetaminophen  (TYLENOL ) tablet 650 mg (650 mg Oral Given 01/01/24 1658)                                     Medical Decision Making Medical Decision Making Nursing notes are reviewed. Differential diagnosis for this patient would include but not limited to: Per chest pain, musculoskeletal chest pain, strain, anxiety, CHF, other  Emergency Department Course:  Vital signs and pulse oximetry are reviewed, evaluated by myself and found to be within normal limits prior to final disposition. Findings of laboratory testing and medical imaging are discussed with patient and family that is available. Patient agrees with the medical care plan as follows:  Patient's lab workup reviewed by me and unremarkable.  She has been here for a few hours  and pain has overall improved.  I gave her some Tylenol .  Her headache is gone.  Vitals have been stable and blood pressure is within normal limits.  No protein in her urine.  I did order troponin but she did not want a wait for this test to come back if she was hungry.  Advised to follow-up the results on her MyChart app and return if she changes her mind or has any new or worsening symptoms.  Advised to follow-up with family practice and OB/GYN next week.  Advised Tylenol  as needed for pain.  She eloped prior to receiving discharge paperwork  Problems Addressed: Atypical chest pain: undiagnosed new problem with uncertain prognosis First trimester pregnancy: chronic illness or injury Nonintractable headache, unspecified chronicity pattern, unspecified headache type: self-limited or minor problem  Amount and/or Complexity of Data Reviewed External Data Reviewed: notes.    Details: Outpatient records reviewed and patient was seen by telehealth today and diagnosed with pleuritic chest pain and advised to come to the ER Labs: ordered. Decision-making details documented in ED Course.    Details: Ordered and interpreted by me.  Patient with normal lab workup.  BNP CBC and BMP are fairly unremarkable.  Troponin was ordered and pending and patient did not want  to wait for this test to come back because she was hungry. ECG/medicine tests: ordered and independent interpretation performed. Decision-making details documented in ED Course.    Details: Ordered and interpreted by me in the absence of cardiology and shows sinus rhythm, no acute abnormality or STEMI  Risk OTC drugs. Diagnosis or treatment significantly limited by social determinants of health.    Final diagnoses:  Atypical chest pain  Nonintractable headache, unspecified chronicity pattern, unspecified headache type  First trimester pregnancy    ED Discharge Orders     None          Gennaro Duwaine CROME, DO 01/01/24 2028

## 2024-01-01 NOTE — MAU Provider Note (Signed)
 Event Date/Time   First Provider Initiated Contact with Patient 01/01/24 1227      S [name] is a 23 y.o. [phone] patient who presents to MAU today with complaint of Chest Pain and SOB.  Pt states started this morning.  Has asthma but no relief from albuterol  neb for her child.  She is also SORA and able to talk in full sentences.  Pt states feels better when pressure applied to her sternum.  Denies sick contacts.  BP slightly elevated .  No obstetrical complaints  O BP 129/80 (BP Location: Right Arm)   Pulse 91   Temp 98 F (36.7 C) (Oral)   Resp 18   Ht 5' 5 (1.651 m)   Wt 101.8 kg   LMP 10/30/2023   SpO2 100%   BMI 37.34 kg/m  Physical Exam Vitals and nursing note reviewed.  Constitutional:      General: She is not in acute distress.    Appearance: She is well-developed. She is obese. She is not ill-appearing.  HENT:     Head: Normocephalic and atraumatic.     Mouth/Throat:     Mouth: Mucous membranes are moist.     Pharynx: Oropharynx is clear.  Eyes:     Extraocular Movements: Extraocular movements intact.  Cardiovascular:     Rate and Rhythm: Normal rate and regular rhythm.     Heart sounds: Normal heart sounds.  Pulmonary:     Effort: Pulmonary effort is normal.     Breath sounds: Normal breath sounds.  Chest:     Chest wall: Tenderness present.  Abdominal:     General: Bowel sounds are normal.     Palpations: Abdomen is soft.     Tenderness: There is no abdominal tenderness.  Musculoskeletal:        General: Normal range of motion.     Cervical back: Normal range of motion.  Skin:    General: Skin is warm and dry.  Neurological:     Mental Status: She is alert and oriented to person, place, and time.     Motor: No weakness.  Psychiatric:        Mood and Affect: Mood normal.        Behavior: Behavior normal.     A/P Medical screening exam complete Spoke with Dr. Pamella at [facility] and transferred and per their report okay to go to their triage  vs direct bed placement for further evaluation.    Jhonny Augustin BROCKS, MD 01/01/2024 12:28 PM

## 2024-01-01 NOTE — MAU Note (Signed)
 Emily Foster is a 23 y.o. at [redacted]w[redacted]d here in MAU reporting: woke up around 0600, everything felt tight, couldn't breath. Got up, tried a breathing treatment.  Didn't help.  Has a HA, tried Tylenol  around 0800, still has it.  When she coughs, it hurts.  Gets up to take care of her son and it feels like her heart is beating super fast.  Pt rocking in triage. Denies fever or congestion, not coughing anything up. Onset of complaint: 0600 Pain score: HA 8, chest pain 10 There were no vitals filed for this visit.    Lab orders placed from triage:

## 2024-01-05 ENCOUNTER — Ambulatory Visit: Admitting: Family Medicine

## 2024-01-05 ENCOUNTER — Encounter: Payer: Self-pay | Admitting: Family Medicine

## 2024-01-05 ENCOUNTER — Ambulatory Visit

## 2024-01-05 ENCOUNTER — Other Ambulatory Visit: Payer: Self-pay

## 2024-01-05 VITALS — BP 133/72 | HR 80

## 2024-01-05 DIAGNOSIS — O3680X0 Pregnancy with inconclusive fetal viability, not applicable or unspecified: Secondary | ICD-10-CM

## 2024-01-05 DIAGNOSIS — Z3687 Encounter for antenatal screening for uncertain dates: Secondary | ICD-10-CM | POA: Diagnosis not present

## 2024-01-05 DIAGNOSIS — Z3480 Encounter for supervision of other normal pregnancy, unspecified trimester: Secondary | ICD-10-CM

## 2024-01-05 DIAGNOSIS — Z3A00 Weeks of gestation of pregnancy not specified: Secondary | ICD-10-CM | POA: Diagnosis not present

## 2024-01-05 DIAGNOSIS — O021 Missed abortion: Secondary | ICD-10-CM | POA: Diagnosis not present

## 2024-01-05 MED ORDER — MISOPROSTOL 200 MCG PO TABS: 1000.0000 ug | ORAL_TABLET | Freq: Once | ORAL | 1 refills | Status: DC

## 2024-01-05 NOTE — Progress Notes (Signed)
 Ultrasounds Results Note  SUBJECTIVE HPI:  [name] is a 23 y.o. [phone] at [redacted]w[redacted]d by LMP who presents to the [facility] for followup ultrasound results. The patient denies abdominal pain or vaginal bleeding.  Upon review of the patient's records, patient was first seen for viability scan.   BHCG on that day was 83524.  Ultrasound showed IUGS with yolk sac.  Repeat ultrasound was performed earlier today.   Past Medical History:  Diagnosis Date   Allergy    Asthma    UTI (urinary tract infection)    Past Surgical History:  Procedure Laterality Date   CESAREAN SECTION N/A 04/23/2022   Procedure: CESAREAN SECTION;  Surgeon: Alger Gong, MD;  Location: [facility] LD ORS;  Service: Obstetrics;  Laterality: N/A;   TOE SURGERY     Social History   Socioeconomic History   Marital status: Single    Spouse name: Not on file   Number of children: Not on file   Years of education: Not on file   Highest education level: Some college, no degree  Occupational History   Not on file  Tobacco Use   Smoking status: Never   Smokeless tobacco: Never  Vaping Use   Vaping status: Former  Substance and Sexual Activity   Alcohol use: No   Drug use: No   Sexual activity: Yes    Birth control/protection: None  Other Topics Concern   Not on file  Social History Narrative   Planning to go community college   Lives with boyfriend   Social Drivers of Health   Financial Resource Strain: Low Risk  (12/22/2023)   Overall Financial Resource Strain (CARDIA)    Difficulty of Paying Living Expenses: Not hard at all  Food Insecurity: No Food Insecurity (12/22/2023)   Hunger Vital Sign    Worried About Running Out of Food in the Last Year: Never true    Ran Out of Food in the Last Year: Never true  Transportation Needs: No Transportation Needs (12/22/2023)   PRAPARE - Administrator, Civil Service (Medical): No    Lack of Transportation (Non-Medical): No  Physical Activity:  Inactive (12/22/2023)   Exercise Vital Sign    Days of Exercise per Week: 0 days    Minutes of Exercise per Session: Not on file  Stress: No Stress Concern Present (12/22/2023)   [employer] of Occupational Health - Occupational Stress Questionnaire    Feeling of Stress: Only a little  Social Connections: Moderately Integrated (12/22/2023)   Social Connection and Isolation Panel    Frequency of Communication with Friends and Family: More than three times a week    Frequency of Social Gatherings with Friends and Family: Once a week    Attends Religious Services: More than 4 times per year    Active Member of [employer] or Organizations: No    Attends Engineer, structural: Not on file    Marital Status: Living with partner  Intimate Partner Violence: Unknown (02/14/2022)   Received from [facility]   HITS    Physically Hurt: Not on file    Insult or Talk Down To: Not on file    Threaten Physical Harm: Not on file    Scream or Curse: Not on file   Current Outpatient Medications on File Prior to Visit  Medication Sig Dispense Refill   amoxicillin -clavulanate (AUGMENTIN ) 875-125 MG tablet Take 1 tablet by mouth 2 (two) times daily. (Patient not taking: Reported on 12/22/2023) 14 tablet  0   Norethindrone Acetate-Ethinyl Estrad-FE (LOESTRIN 24 FE) 1-20 MG-MCG(24) tablet Take 1 tablet by mouth daily. (Patient not taking: Reported on 12/22/2023) 90 tablet 3   triamcinolone  cream (KENALOG ) 0.1 % Apply 1 Application topically 2 (two) times daily. (Patient not taking: Reported on 12/22/2023) 30 g 0   No current facility-administered medications on file prior to visit.   Allergies  Allergen Reactions   Soy Allergy (Obsolete) Itching, Rash and Swelling    I have reviewed patient's Past Medical Hx, Surgical Hx, Family Hx, Social Hx, medications and allergies.   Review of Systems Review of Systems  Constitutional: Negative for fever and chills.  Gastrointestinal: Negative for nausea,  vomiting, abdominal pain, diarrhea and constipation.  Genitourinary: Negative for dysuria.  Musculoskeletal: Negative for back pain.  Neurological: Negative for dizziness and weakness.    Physical Exam  BP 133/72   Pulse 80   LMP 10/30/2023   GENERAL: Well-developed, well-nourished female in no acute distress.  HEENT: Normocephalic, atraumatic.   LUNGS: Effort normal ABDOMEN: soft, non-tender HEART: Regular rate  SKIN: Warm, dry and without erythema PSYCH: Normal mood and affect NEURO: Alert and oriented x 4  LAB RESULTS No results found for this or any previous visit (from the past 24 hours).  IMAGING IUGS seen, prior yolk sac is no longer present. ASSESSMENT/PLAN 1. Missed ab (Primary) U/s today shows IUGS no progression since last and now with no yolk sac.  Discussed management options including R/B/A of expectant management, Cytotec  and D & E.Patient opted for medication. Rx sent in  - misoprostol  (CYTOTEC ) 200 MCG tablet; Place 5 tablets (1,000 mcg total) vaginally once for 1 dose. Repeat if no results in 48 hours  Dispense: 5 tablet; Refill: 1    Fredirick Glenys RAMAN, MD  01/05/2024  11:53 AM

## 2024-01-12 ENCOUNTER — Inpatient Hospital Stay (HOSPITAL_COMMUNITY)

## 2024-01-12 ENCOUNTER — Inpatient Hospital Stay (HOSPITAL_COMMUNITY)
Admission: AD | Admit: 2024-01-12 | Discharge: 2024-01-12 | Disposition: A | Discharge status: Home / Self Care | Attending: Family Medicine | Admitting: Family Medicine

## 2024-01-12 DIAGNOSIS — O021 Missed abortion: Secondary | ICD-10-CM | POA: Insufficient documentation

## 2024-01-12 DIAGNOSIS — O034 Incomplete spontaneous abortion without complication: Secondary | ICD-10-CM

## 2024-01-12 DIAGNOSIS — N939 Abnormal uterine and vaginal bleeding, unspecified: Secondary | ICD-10-CM | POA: Diagnosis present

## 2024-01-12 DIAGNOSIS — Z3A01 Less than 8 weeks gestation of pregnancy: Secondary | ICD-10-CM

## 2024-01-12 DIAGNOSIS — R112 Nausea with vomiting, unspecified: Secondary | ICD-10-CM | POA: Diagnosis present

## 2024-01-12 LAB — CBC WITH DIFFERENTIAL/PLATELET
Abs Immature Granulocytes: 0.04 K/uL (ref 0.00–0.07)
Basophils Absolute: 0 K/uL (ref 0.0–0.1)
Basophils Relative: 1 %
Eosinophils Absolute: 0.1 K/uL (ref 0.0–0.5)
Eosinophils Relative: 1 %
HCT: 37.2 % (ref 36.0–46.0)
Hemoglobin: 11.9 g/dL — ABNORMAL LOW (ref 12.0–15.0)
Immature Granulocytes: 1 %
Lymphocytes Relative: 27 %
Lymphs Abs: 2 K/uL (ref 0.7–4.0)
MCH: 27.6 pg (ref 26.0–34.0)
MCHC: 32 g/dL (ref 30.0–36.0)
MCV: 86.3 fL (ref 80.0–100.0)
Monocytes Absolute: 0.5 K/uL (ref 0.1–1.0)
Monocytes Relative: 7 %
Neutro Abs: 4.6 K/uL (ref 1.7–7.7)
Neutrophils Relative %: 63 %
Platelets: 255 K/uL (ref 150–400)
RBC: 4.31 MIL/uL (ref 3.87–5.11)
RDW: 12.6 % (ref 11.5–15.5)
WBC: 7.2 K/uL (ref 4.0–10.5)
nRBC: 0 % (ref 0.0–0.2)

## 2024-01-12 MED ORDER — FENTANYL CITRATE (PF) 100 MCG/2ML IJ SOLN
100.0000 ug | Freq: Once | INTRAMUSCULAR | Status: AC
  Administered 2024-01-12: 100 ug via INTRAVENOUS
  Filled 2024-01-12: qty 2

## 2024-01-12 MED ORDER — ONDANSETRON HCL 4 MG/2ML IJ SOLN
4.0000 mg | Freq: Once | INTRAMUSCULAR | Status: AC
  Administered 2024-01-12: 4 mg via INTRAVENOUS
  Filled 2024-01-12: qty 2

## 2024-01-12 MED ORDER — HYDROMORPHONE HCL 1 MG/ML IJ SOLN
1.0000 mg | Freq: Once | INTRAMUSCULAR | Status: AC
  Administered 2024-01-12: 1 mg via INTRAVENOUS
  Filled 2024-01-12: qty 1

## 2024-01-12 MED ORDER — DOXYCYCLINE HYCLATE 100 MG PO TABS
200.0000 mg | ORAL_TABLET | Freq: Once | ORAL | Status: AC
  Administered 2024-01-12: 200 mg via ORAL
  Filled 2024-01-12: qty 2

## 2024-01-12 MED ORDER — LIDOCAINE HCL (PF) 1 % IJ SOLN
30.0000 mL | Freq: Once | INTRAMUSCULAR | Status: DC
Start: 2024-01-12 — End: 2024-01-12
  Filled 2024-01-12: qty 30

## 2024-01-12 MED ORDER — LACTATED RINGERS IV BOLUS
1000.0000 mL | Freq: Once | INTRAVENOUS | Status: AC
  Administered 2024-01-12: 1000 mL via INTRAVENOUS

## 2024-01-12 NOTE — Procedures (Signed)
   MANUAL VACUUM ASPIRATION PROCEDURE NOTE  Location of Procedure: MAU   Emily Foster is 23 y.o. [phone] presenting for ongoing symptoms from incomplete SAB who now elects to undergo MVA procedure  PROCEDURE DATE: 01/12/2024  PREOPERATIVE DIAGNOSIS: approximately 5 weeks diagnosed with Incomplete abortion on ultrasound POSTOPERATIVE DIAGNOSIS: The same PROCEDURE:   MANUAL VACUUM ASPIRATION under ULTRASOUND GUIDANCE SURGEON:  Donnice CHRISTELLA Carolus   INDICATIONS: 23 y.o. [phone] with Incomplete abortion at [redacted] weeks gestation, needing surgical completion.  Risks of surgery were discussed with the patient including but not limited to: bleeding which may require transfusion; infection which may require antibiotics; injury to uterus or surrounding organs; need for additional procedures including laparotomy or laparoscopy; possibility of intrauterine scarring which may impair future fertility; and other postoperative/anesthesia complications. Written informed consent was obtained.    FINDINGS:  moderate amounts of products of conception, specimen sent to pathology.  ANESTHESIA: Paracervical Block 20mL 1% lidocaine  ESTIMATED BLOOD LOSS:  Less than 20 ml. SPECIMENS:  Products of conception sent to pathology COMPLICATIONS:  None immediate.  PROCEDURE DETAILS:  Prior to the start of the procedure the uterus was examined using ultrasound to confirm failed pregnancy. The patient received oral  Doxycycline  200mg  prior to the start of the procedure as well as 100 mcg IV fentanyl .  Medications were taken at least 10 minutes prior to the start of the procedure.  After an adequate timeout was performed, she was placed in the dorsal lithotomy position and examined.  A vaginal speculum was then placed in the patient's vagina and the vagina and cervix were cleaned using Betadinex3. Next a single tooth tenaculum was applied to the anterior lip of the cervix.  A paracervical block using 20 ml of 0.5% Lidocaine  was  administered. The cervix was gently dilated to accommodate a 9 mm suction tube under ultrasound guidance. The suction curette was advanced gently advanced to the uterine fundus. The MVA apparatus was activated to create adequate suction and curette slowly rotated to clear the uterus of products of conception.  Ultrasound was used to assure removal of products of conception. A second pass was performed. There was minimal bleeding noted and the tenaculum removed with good hemostasis noted.   All instruments were removed from the patient's vagina.     01/12/2024 4:36 PM  Donnice CHRISTELLA Carolus, MD/MPH Attending Family Medicine Physician, [facility] for [facility], [facility]

## 2024-01-12 NOTE — MAU Note (Signed)
 Emily Foster is a 23 y.o. at Unknown here in MAU reporting: Was given cytotec  for missed AB. Took meds as prescribed. On Friday pt stated she passed the fetus and the bleeding stopped. Sat and Sunday no  Bleeding. This morning she woke up bleeding and b=passing clots and having N/V.   LMP:  Onset of complaint: this morning.  Pain score: 10 There were no vitals filed for this visit.   FHT:  Lab orders placed from triage:

## 2024-01-12 NOTE — MAU Provider Note (Signed)
 History     CSN: [id number]  Arrival date and time: 01/12/24 1048   Event Date/Time   First Provider Initiated Contact with Patient 01/12/24 1131      Chief Complaint  Patient presents with   Vaginal Bleeding   HPI [place] Preziosi is a 23 y.o. [name] at Unknown who presents for vaginal bleeding. Diagnosed with missed abortion last week & prescribed cytotec  for management. Took cytotec  on Friday & reports she passed tissue later in the day. Has no symptoms over the weekend. Woke up this morning with n/v, abdominal pain, & heavy bleeding. Passing small clots & saturated her pad. Vomited twice & continues to have nausea. Denies fever/chills, diarrhea.   OB History     Gravida  4   Para  1   Term  0   Preterm  1   AB  2   Living  1      SAB  2   IAB  0   Ectopic  0   Multiple  0   Live Births  1           Past Medical History:  Diagnosis Date   Allergy    Asthma    UTI (urinary tract infection)     Past Surgical History:  Procedure Laterality Date   CESAREAN SECTION N/A 04/23/2022   Procedure: CESAREAN SECTION;  Surgeon: Alger Gong, MD;  Location: [facility] LD ORS;  Service: Obstetrics;  Laterality: N/A;   TOE SURGERY      Family History  Problem Relation Age of Onset   Diabetes Mother    Diabetes Paternal Grandmother    Diabetes Paternal Grandfather     Social History   Tobacco Use   Smoking status: Never   Smokeless tobacco: Never  Vaping Use   Vaping status: Former  Substance Use Topics   Alcohol use: No   Drug use: No    Allergies:  Allergies  Allergen Reactions   Soy Allergy (Obsolete) Itching, Rash and Swelling    Medications Prior to Admission  Medication Sig Dispense Refill Last Dose/Taking   amoxicillin -clavulanate (AUGMENTIN ) 875-125 MG tablet Take 1 tablet by mouth 2 (two) times daily. (Patient not taking: Reported on 12/22/2023) 14 tablet 0    misoprostol  (CYTOTEC ) 200 MCG tablet Place 5 tablets (1,000 mcg total) vaginally  once for 1 dose. Repeat if no results in 48 hours 5 tablet 1    Norethindrone Acetate-Ethinyl Estrad-FE (LOESTRIN 24 FE) 1-20 MG-MCG(24) tablet Take 1 tablet by mouth daily. (Patient not taking: Reported on 12/22/2023) 90 tablet 3    triamcinolone  cream (KENALOG ) 0.1 % Apply 1 Application topically 2 (two) times daily. (Patient not taking: Reported on 12/22/2023) 30 g 0     Review of Systems  All other systems reviewed and are negative.  Physical Exam   unknown if currently breastfeeding.  Physical Exam Vitals and nursing note reviewed. Exam conducted with a chaperone present.  Constitutional:      General: She is not in acute distress.    Appearance: She is well-developed. She is not ill-appearing.  HENT:     Head: Normocephalic and atraumatic.  Eyes:     General: No scleral icterus.       Right eye: No discharge.        Left eye: No discharge.     Conjunctiva/sclera: Conjunctivae normal.  Pulmonary:     Effort: Pulmonary effort is normal. No respiratory distress.  Genitourinary:    Comments: Pelvic: Small amount of  dark red blood in vault. Scant tissue gently removed from os. Cervix visually closed.  Neurological:     General: No focal deficit present.     Mental Status: She is alert.  Psychiatric:        Mood and Affect: Mood normal.        Behavior: Behavior normal.     MAU Course  Procedures Results for orders placed or performed during the hospital encounter of 01/12/24 (from the past 24 hours)  Type and screen     Status: None (Preliminary result)   Collection Time: 01/12/24 11:35 AM  Result Value Ref Range   ABO/RH(D) PENDING    Antibody Screen PENDING    Sample Expiration      01/15/2024,2359 Performed at [facility], [address] [place], [place] [address]   CBC with Differential/Platelet     Status: Abnormal   Collection Time: 01/12/24 11:36 AM  Result Value Ref Range   WBC 7.2 4.0 - 10.5 K/uL   RBC 4.31 3.87 - 5.11 MIL/uL   Hemoglobin 11.9 (L)  12.0 - 15.0 g/dL   HCT 62.7 63.9 - 53.9 %   MCV 86.3 80.0 - 100.0 fL   MCH 27.6 26.0 - 34.0 pg   MCHC 32.0 30.0 - 36.0 g/dL   RDW 87.3 88.4 - 84.4 %   Platelets 255 150 - 400 K/uL   nRBC 0.0 0.0 - 0.2 %   Neutrophils Relative % 63 %   Neutro Abs 4.6 1.7 - 7.7 K/uL   Lymphocytes Relative 27 %   Lymphs Abs 2.0 0.7 - 4.0 K/uL   Monocytes Relative 7 %   Monocytes Absolute 0.5 0.1 - 1.0 K/uL   Eosinophils Relative 1 %   Eosinophils Absolute 0.1 0.0 - 0.5 K/uL   Basophils Relative 1 %   Basophils Absolute 0.0 0.0 - 0.1 K/uL   Immature Granulocytes 1 %   Abs Immature Granulocytes 0.04 0.00 - 0.07 K/uL   US  OB Transvaginal Result Date: 01/12/2024 CLINICAL DATA:  6628 Missed abortion 6628 [id number] Vaginal bleeding in pregnancy, first trimester 181855. Gestational age by last menstrual period 10 weeks and 4 days. Last menstrual period 10/30/2023 EXAM: TRANSVAGINAL OB ULTRASOUND TECHNIQUE: Transvaginal ultrasound was performed for complete evaluation of the gestation as well as the maternal uterus, adnexal regions, and pelvic cul-de-sac. COMPARISON:  Ultrasound Ob 01/05/2024 FINDINGS: Intrauterine gestational sac: Single interval migration to the lower uterine segment endometrium Yolk sac:  Not Visualized. Embryo:  Not Visualized. Cardiac Activity: Not Visualized. MSD: 14.7 mm   6 w   2 d Subchorionic hemorrhage:  None visualized. Maternal uterus/adnexae: Bilateral ovaries are not visualized. Other: No free fluid. IMPRESSION: 1. Single intrauterine gestational sac with interval migration to the lower uterine segment. No yolk sac or embryo development/identified. Findings consistent with an ongoing miscarriage. 2. Bilateral ovaries are not visualized. Electronically Signed   By: Morgane  Naveau M.D.   On: 01/12/2024 13:09    MDM   Assessment and Plan   1. Incomplete miscarriage    -Ultrasound shows IUGS in LUS. Discussed management options including repeat dose of cytotec  vs MVA. Patient desires  MVA. Procedure performed by Dr. Lola, see his note -Patient reports resolution of pain after procedure. VSS. Minimal bleeding. Stable for discharge -Discussed pelvic rest & reviewed reasons to return to MAU -Keep scheduled f/u in office next week  Rocky Satterfield 01/12/2024, 11:31 AM

## 2024-01-12 NOTE — Progress Notes (Signed)
 23 y.o. [name] with Incomplete abortion at [redacted] weeks gestation, needing surgical completion. US  images reviewed personally. Risks of surgery were discussed with the patient including but not limited to: bleeding which may require transfusion; infection which may require antibiotics; injury to uterus or surrounding organs; need for additional procedures including laparotomy or laparoscopy; possibility of intrauterine scarring which may impair future fertility; and other postoperative/anesthesia complications. Written informed consent was obtained.    01/12/2024 2:10 PM  Emily CHRISTELLA Carolus, MD/MPH Attending Family Medicine Physician, [facility] for [facility], [facility]

## 2024-01-14 ENCOUNTER — Ambulatory Visit: Payer: Self-pay | Admitting: Family Medicine

## 2024-01-14 LAB — TYPE AND SCREEN

## 2024-01-14 LAB — SURGICAL PATHOLOGY

## 2024-01-22 ENCOUNTER — Encounter: Admitting: Obstetrics and Gynecology

## 2024-05-02 ENCOUNTER — Telehealth: Admitting: Family

## 2024-05-02 DIAGNOSIS — B3731 Acute candidiasis of vulva and vagina: Secondary | ICD-10-CM | POA: Diagnosis not present

## 2024-05-02 MED ORDER — FLUCONAZOLE 150 MG PO TABS: 150.0000 mg | ORAL_TABLET | ORAL | 0 refills | Status: DC | PRN

## 2024-05-02 NOTE — Progress Notes (Signed)

## 2024-05-14 ENCOUNTER — Telehealth: Payer: Self-pay

## 2024-05-14 NOTE — Telephone Encounter (Signed)
 Patient called stating she has taken several pregnancy test and a few of them were positive and a few were negative. She states her period was 5 days late. Advised patient to take another test next week and to try to take it early in the morning. Understanding was voiced. Emily Foster, CMA

## 2024-06-08 ENCOUNTER — Telehealth: Admitting: Physician Assistant

## 2024-06-08 DIAGNOSIS — B379 Candidiasis, unspecified: Secondary | ICD-10-CM

## 2024-06-08 MED ORDER — FLUCONAZOLE 150 MG PO TABS: ORAL_TABLET | ORAL | 0 refills | Status: AC

## 2024-06-08 NOTE — Progress Notes (Signed)

## 2024-06-18 ENCOUNTER — Other Ambulatory Visit: Payer: Self-pay | Admitting: Medical Genetics

## 2024-07-07 ENCOUNTER — Telehealth: Admitting: Nurse Practitioner

## 2024-07-07 DIAGNOSIS — R21 Rash and other nonspecific skin eruption: Secondary | ICD-10-CM | POA: Diagnosis not present

## 2024-07-07 NOTE — Progress Notes (Signed)
 Polette,  It seems like this might be a reaction to the chemical in the product you used. There is not treatment for that aside from comfort measures to the area.   I would recommend a mild soap like Dove only. A topical ointment based moisturizer like Vasoline or Aquaphor. Avoid clothes that rub in the area or tight fitting clothing.   Wash with warm water  (not hot or cold).   If the area starts to look infected with drainage or becomes warm please schedule a follow up with your primary care or OBGYN as we cannot assess genital regions over camera.   E Visit for Rash  We are sorry that you are not feeling well. Here is how we plan to help!  Based on what you shared with me it looks like you have contact dermatitis.  Contact dermatitis is a skin rash caused by something that touches the skin and causes irritation or inflammation.  Your skin may be red, swollen, dry, cracked, and itch.  The rash should go away in a few days but can last a few weeks.  If you get a rash, it's important to figure out what caused it so the irritant can be avoided in the future.    HOME CARE:  Take cool showers and avoid direct sunlight. Apply cool compress or wet dressings. Take a bath in an oatmeal bath.  Sprinkle content of one Aveeno packet under running faucet with comfortably warm water .  Bathe for 15-20 minutes, 1-2 times daily.  Pat dry with a towel. Do not rub the rash. Take an antihistamine like Benadryl  for widespread rashes that itch.  The adult dose of Benadryl  is 25-50 mg by mouth 4 times daily. Caution:  This type of medication may cause sleepiness.  Do not drink alcohol, drive, or operate dangerous machinery while taking antihistamines.  Do not take these medications if you have prostate enlargement.  Read package instructions thoroughly on all medications that you take.  GET HELP RIGHT AWAY IF:  Symptoms don't go away after treatment. Severe itching that persists. If you rash spreads or  swells. If you rash begins to smell. If it blisters and opens or develops a yellow-brown crust. You develop a fever. You have a sore throat. You become short of breath.  MAKE SURE YOU:  Understand these instructions. Will watch your condition. Will get help right away if you are not doing well or get worse.  Thank you for choosing an e-visit. Your e-visit answers were reviewed by a board certified advanced clinical practitioner to complete your personal care plan. Depending upon the condition, your plan could have included both over the counter or prescription medications. Please review your pharmacy choice. Be sure that the pharmacy you have chosen is open so that you can pick up your prescription now.  If there is a problem you may message your provider in MyChart to have the prescription routed to another pharmacy. Your safety is important to us . If you have drug allergies check your prescription carefully.  For the next 24 hours, you can use MyChart to ask questions about todays visit, request a non-urgent call back, or ask for a work or school excuse from your e-visit provider. You will get an email in the next two days asking about your experience. I hope that your e-visit has been valuable and will speed your recovery.  I have spent 5 minutes in review of e-visit questionnaire, review and updating patient chart, medical decision making and  response to patient.   Lauraine Kitty, FNP

## 2024-07-12 ENCOUNTER — Other Ambulatory Visit: Payer: Self-pay | Admitting: Medical Genetics

## 2024-07-12 DIAGNOSIS — Z006 Encounter for examination for normal comparison and control in clinical research program: Secondary | ICD-10-CM

## 2024-07-29 ENCOUNTER — Other Ambulatory Visit
